# Patient Record
Sex: Female | Born: 1965 | Race: White | Hispanic: No | Smoking: Former smoker
Health system: Southern US, Community
[De-identification: ages and names within clinical notes are randomized; demographics above are authoritative.]

## PROBLEM LIST (undated history)

## (undated) DIAGNOSIS — J189 Pneumonia, unspecified organism: Secondary | ICD-10-CM

## (undated) DIAGNOSIS — M25559 Pain in unspecified hip: Secondary | ICD-10-CM

## (undated) DIAGNOSIS — M1990 Unspecified osteoarthritis, unspecified site: Secondary | ICD-10-CM

## (undated) DIAGNOSIS — E7800 Pure hypercholesterolemia, unspecified: Secondary | ICD-10-CM

## (undated) DIAGNOSIS — F419 Anxiety disorder, unspecified: Secondary | ICD-10-CM

## (undated) DIAGNOSIS — E871 Hypo-osmolality and hyponatremia: Secondary | ICD-10-CM

## (undated) DIAGNOSIS — Z9989 Dependence on other enabling machines and devices: Secondary | ICD-10-CM

## (undated) HISTORY — PX: TONSILLECTOMY: SUR1361

---

## 2000-06-11 ENCOUNTER — Other Ambulatory Visit: Admission: RE | Admit: 2000-06-11 | Discharge: 2000-06-11 | Payer: Self-pay | Admitting: Obstetrics and Gynecology

## 2000-09-13 ENCOUNTER — Encounter (INDEPENDENT_AMBULATORY_CARE_PROVIDER_SITE_OTHER): Payer: Self-pay | Admitting: Specialist

## 2000-09-13 ENCOUNTER — Ambulatory Visit (HOSPITAL_COMMUNITY): Admission: RE | Admit: 2000-09-13 | Discharge: 2000-09-13 | Payer: Self-pay | Admitting: Obstetrics and Gynecology

## 2000-09-28 ENCOUNTER — Other Ambulatory Visit: Admission: RE | Admit: 2000-09-28 | Discharge: 2000-09-28 | Payer: Self-pay | Admitting: Obstetrics and Gynecology

## 2000-11-11 ENCOUNTER — Other Ambulatory Visit: Admission: RE | Admit: 2000-11-11 | Discharge: 2000-11-11 | Payer: Self-pay | Admitting: *Deleted

## 2000-11-11 ENCOUNTER — Encounter (INDEPENDENT_AMBULATORY_CARE_PROVIDER_SITE_OTHER): Payer: Self-pay

## 2000-12-20 ENCOUNTER — Encounter (INDEPENDENT_AMBULATORY_CARE_PROVIDER_SITE_OTHER): Payer: Self-pay | Admitting: Specialist

## 2000-12-20 ENCOUNTER — Other Ambulatory Visit: Admission: RE | Admit: 2000-12-20 | Discharge: 2000-12-20 | Payer: Self-pay | Admitting: Obstetrics and Gynecology

## 2001-07-01 ENCOUNTER — Other Ambulatory Visit: Admission: RE | Admit: 2001-07-01 | Discharge: 2001-07-01 | Payer: Self-pay | Admitting: Obstetrics and Gynecology

## 2002-07-20 ENCOUNTER — Other Ambulatory Visit: Admission: RE | Admit: 2002-07-20 | Discharge: 2002-07-20 | Payer: Self-pay | Admitting: Obstetrics and Gynecology

## 2004-05-09 ENCOUNTER — Encounter: Admission: RE | Admit: 2004-05-09 | Discharge: 2004-05-09 | Payer: Self-pay | Admitting: Family Medicine

## 2005-07-22 ENCOUNTER — Other Ambulatory Visit: Admission: RE | Admit: 2005-07-22 | Discharge: 2005-07-22 | Payer: Self-pay | Admitting: Obstetrics and Gynecology

## 2005-08-06 ENCOUNTER — Ambulatory Visit (HOSPITAL_COMMUNITY): Admission: RE | Admit: 2005-08-06 | Discharge: 2005-08-06 | Payer: Self-pay | Admitting: Obstetrics and Gynecology

## 2005-09-01 ENCOUNTER — Encounter: Admission: RE | Admit: 2005-09-01 | Discharge: 2005-09-01 | Payer: Self-pay | Admitting: Obstetrics and Gynecology

## 2007-11-17 ENCOUNTER — Encounter: Admission: RE | Admit: 2007-11-17 | Discharge: 2007-11-17 | Payer: Self-pay | Admitting: Family Medicine

## 2008-12-11 ENCOUNTER — Ambulatory Visit (HOSPITAL_COMMUNITY): Admission: RE | Admit: 2008-12-11 | Discharge: 2008-12-11 | Payer: Self-pay | Admitting: Family Medicine

## 2008-12-17 ENCOUNTER — Encounter: Admission: RE | Admit: 2008-12-17 | Discharge: 2008-12-17 | Payer: Self-pay | Admitting: Family Medicine

## 2010-11-29 ENCOUNTER — Encounter: Payer: Self-pay | Admitting: Obstetrics and Gynecology

## 2010-11-30 ENCOUNTER — Encounter: Payer: Self-pay | Admitting: Family Medicine

## 2011-03-27 NOTE — Op Note (Signed)
[facility]  Patient:    Catherine Leonard, Catherine Leonard                       MRN: [id number] Proc. Date: 09/13/00 Adm. Date:  [id number] Attending:  Silverio Lay A                           Operative Report  PREOPERATIVE DIAGNOSIS:       Endometrial polyp with metrorrhagia.  POSTOPERATIVE DIAGNOSIS:      Endometrial polyp with metrorrhagia.  OPERATION:                    Hysteroscopy and resection of endometrial                               polyp.  SURGEON:                      Silverio Lay, M.D.  ANESTHESIA:                   MAC sedation plus paracervical block.  ESTIMATED BLOOD LOSS:         Minimal.  DESCRIPTION OF PROCEDURE:     After being informed of the planned procedure with possible complication including bleeding, infection, uterine perforation, need for laparoscopy plus/minus laparotomy, informed consent was obtained. Patient is taken to OR #3 and given MAC sedation.  She is placed in the lithotomy position, prepped and draped in sterile fashion, and bladder is emptied with a Foley catheter.  GYN exam reveals an anteverted uterus, normal size and shape, and two normal adnexa.  A weighted speculum is inserted.  The anterior lip of the cervix is grasped, and a paracervical block is done in the usual fashion using 10 cc of Nesacaine 1%.  The uterus is sounded at 8 cm, and cervix is then systematically dilated with Hegar dilator, up to #31.  This allows [place] to insert operative hysteroscopy under direct visualization.  The uterus is then distended using sorbitol perfusion at 60 mmHg pressure.  This allows [place] good visualization of the uterine cavity showing two normal tubal ostia, normal appearing endometrium, and a posterior mid endometrial polyp of about 1 cm diameter with a thin base.  This polyp is resected using double loop resectoscope at a 60/60 blend.  The base is completely excised.  There is no bleeding.  The endometrial cavity is now normal  in appearance.  Instruments are removed.  Instrument and sponge count complete x 2.  Estimated blood loss is minimal.  Procedure is very well tolerated by the patient who was taken to the recovery room in a well and stable condition. DD:  09/13/00 TD:  09/13/00 Job: [id number]

## 2012-05-16 ENCOUNTER — Other Ambulatory Visit: Payer: Self-pay | Admitting: Family Medicine

## 2012-05-16 DIAGNOSIS — Z1231 Encounter for screening mammogram for malignant neoplasm of breast: Secondary | ICD-10-CM

## 2012-05-18 ENCOUNTER — Ambulatory Visit
Admission: RE | Admit: 2012-05-18 | Discharge: 2012-05-18 | Disposition: A | Discharge status: Home / Self Care | Payer: BC Managed Care – PPO | Source: Ambulatory Visit | Attending: Family Medicine | Admitting: Family Medicine

## 2012-05-18 DIAGNOSIS — Z1231 Encounter for screening mammogram for malignant neoplasm of breast: Secondary | ICD-10-CM

## 2012-05-23 ENCOUNTER — Other Ambulatory Visit: Payer: Self-pay | Admitting: Family Medicine

## 2012-05-23 DIAGNOSIS — R928 Other abnormal and inconclusive findings on diagnostic imaging of breast: Secondary | ICD-10-CM

## 2012-05-26 ENCOUNTER — Ambulatory Visit
Admission: RE | Admit: 2012-05-26 | Discharge: 2012-05-26 | Disposition: A | Discharge status: Home / Self Care | Payer: BC Managed Care – PPO | Source: Ambulatory Visit | Attending: Family Medicine | Admitting: Family Medicine

## 2012-05-26 DIAGNOSIS — R928 Other abnormal and inconclusive findings on diagnostic imaging of breast: Secondary | ICD-10-CM

## 2014-10-03 ENCOUNTER — Other Ambulatory Visit: Payer: Self-pay | Admitting: Family Medicine

## 2014-10-03 ENCOUNTER — Other Ambulatory Visit: Payer: Self-pay

## 2014-10-03 DIAGNOSIS — Z1231 Encounter for screening mammogram for malignant neoplasm of breast: Secondary | ICD-10-CM

## 2014-10-19 ENCOUNTER — Encounter (INDEPENDENT_AMBULATORY_CARE_PROVIDER_SITE_OTHER): Payer: Self-pay

## 2014-10-19 ENCOUNTER — Ambulatory Visit
Admission: RE | Admit: 2014-10-19 | Discharge: 2014-10-19 | Disposition: A | Discharge status: Home / Self Care | Payer: BC Managed Care – PPO | Source: Ambulatory Visit

## 2014-10-19 DIAGNOSIS — Z1231 Encounter for screening mammogram for malignant neoplasm of breast: Secondary | ICD-10-CM

## 2016-03-27 HISTORY — PX: COLONOSCOPY: SHX174

## 2018-10-11 ENCOUNTER — Other Ambulatory Visit: Payer: Self-pay | Admitting: Physician Assistant

## 2018-10-11 DIAGNOSIS — Z1231 Encounter for screening mammogram for malignant neoplasm of breast: Secondary | ICD-10-CM

## 2018-10-12 ENCOUNTER — Ambulatory Visit: Payer: Self-pay

## 2018-11-23 ENCOUNTER — Ambulatory Visit: Payer: Self-pay

## 2019-10-25 ENCOUNTER — Other Ambulatory Visit: Payer: Self-pay

## 2019-10-25 ENCOUNTER — Ambulatory Visit
Admission: RE | Admit: 2019-10-25 | Discharge: 2019-10-25 | Disposition: A | Discharge status: Home / Self Care | Payer: BC Managed Care – PPO | Source: Ambulatory Visit | Attending: Physician Assistant | Admitting: Physician Assistant

## 2019-10-25 DIAGNOSIS — Z1231 Encounter for screening mammogram for malignant neoplasm of breast: Secondary | ICD-10-CM

## 2019-10-27 ENCOUNTER — Other Ambulatory Visit: Payer: Self-pay | Admitting: Physician Assistant

## 2019-10-27 DIAGNOSIS — R928 Other abnormal and inconclusive findings on diagnostic imaging of breast: Secondary | ICD-10-CM

## 2020-04-06 IMAGING — MG DIGITAL SCREENING BILAT W/ TOMO W/ CAD
8 series · 8 of 24 positions shown · non-contrast
Comparison: Previous exam(s).

CLINICAL DATA: Screening.

EXAM:
DIGITAL SCREENING BILATERAL MAMMOGRAM WITH TOMO AND CAD

[L MLO synth-2D]
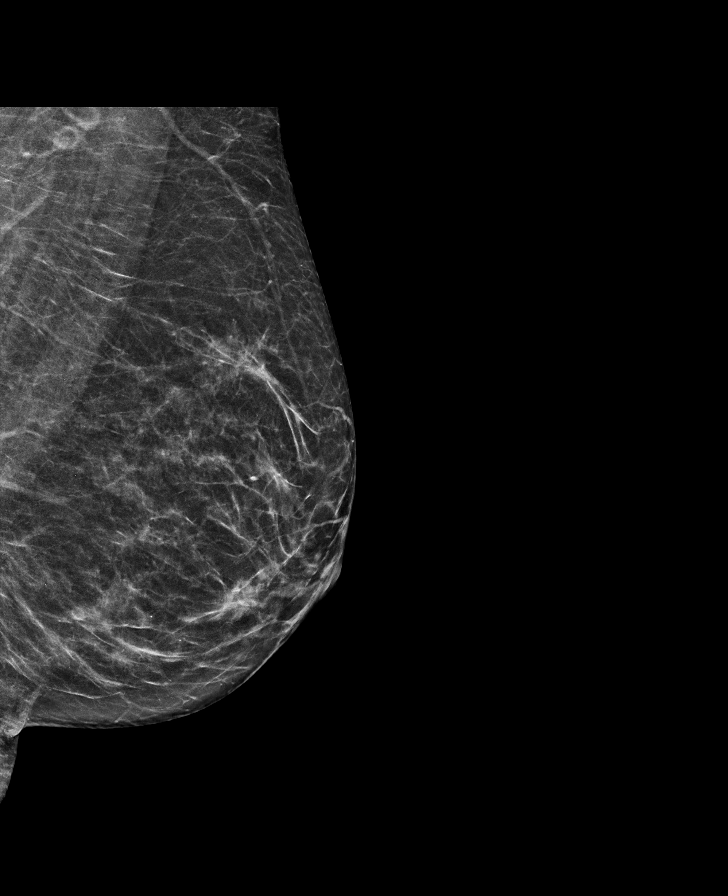

[R CC synth-2D]
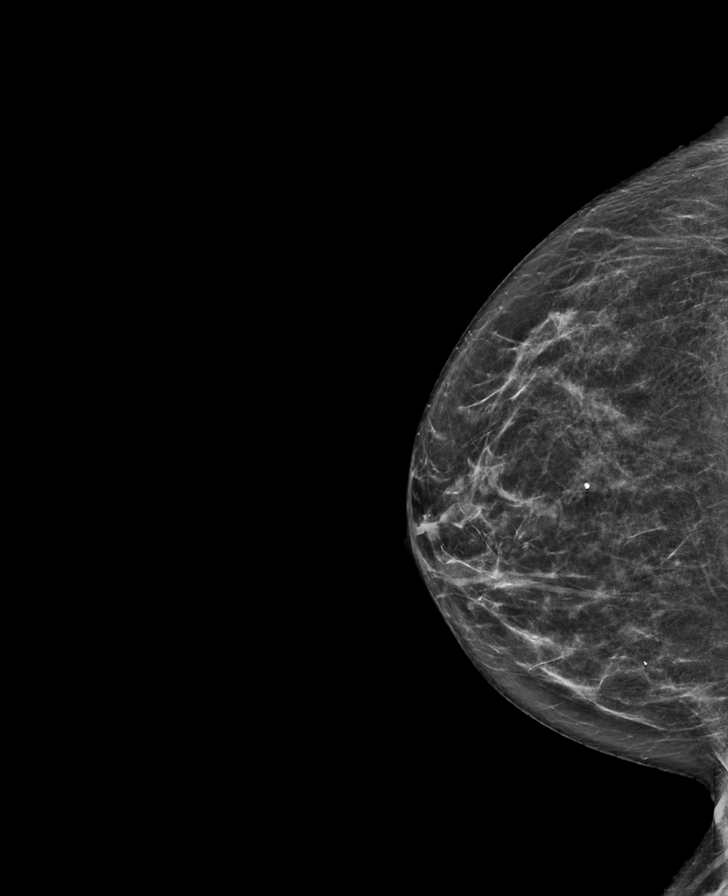

[R MLO synth-2D]
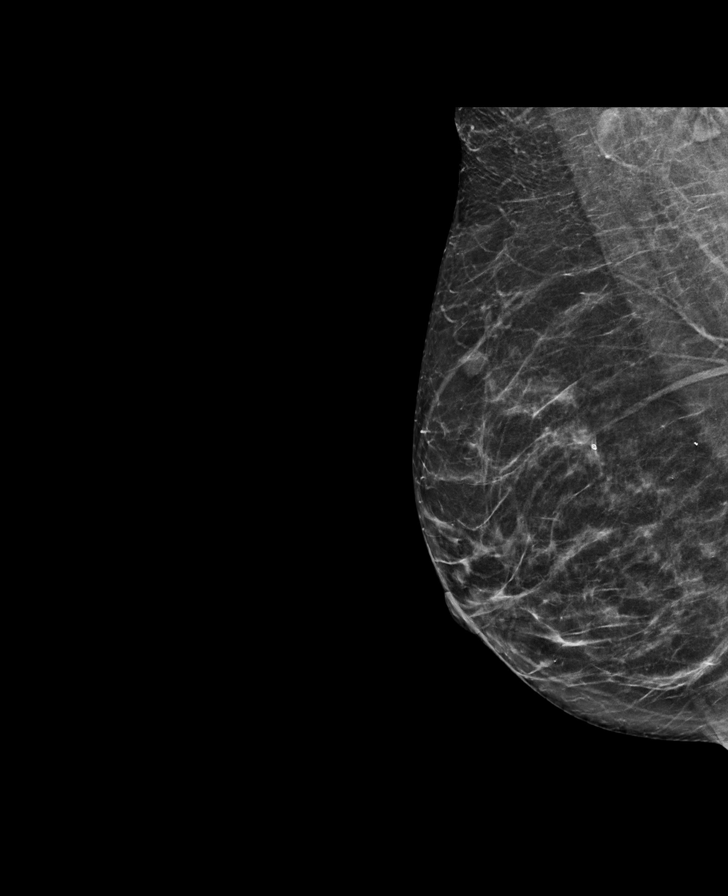

[L CC synth-2D]
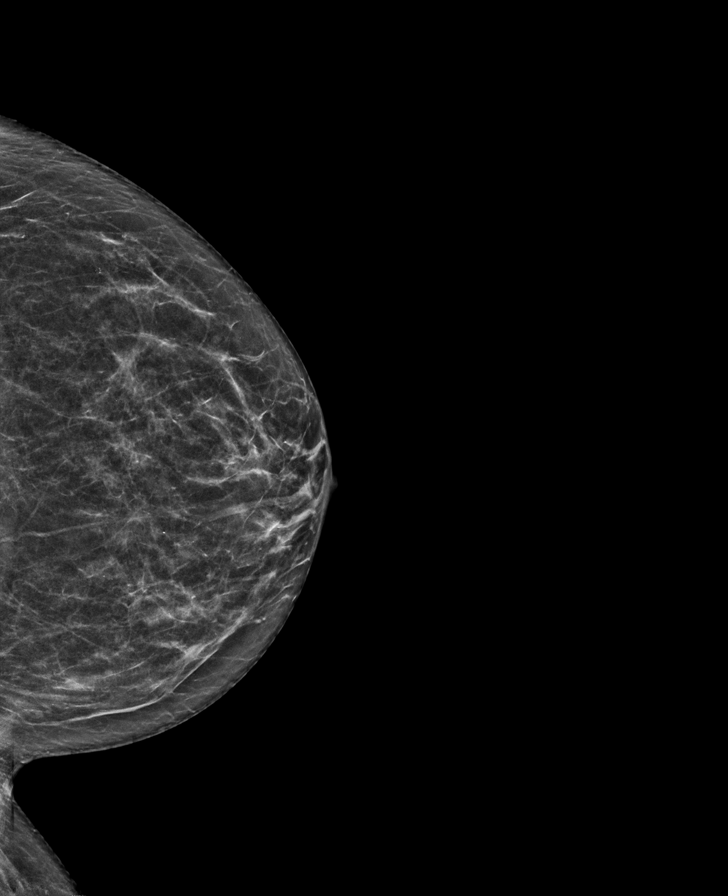

[R CC tomo · tomo slice 31/62.0]
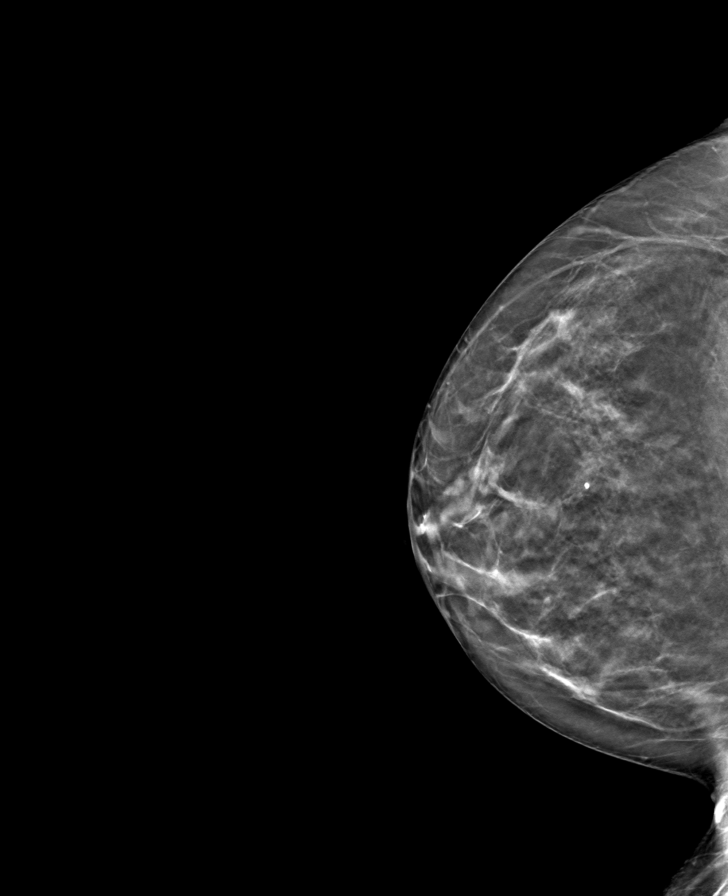

[R MLO tomo · tomo slice 33/64.0]
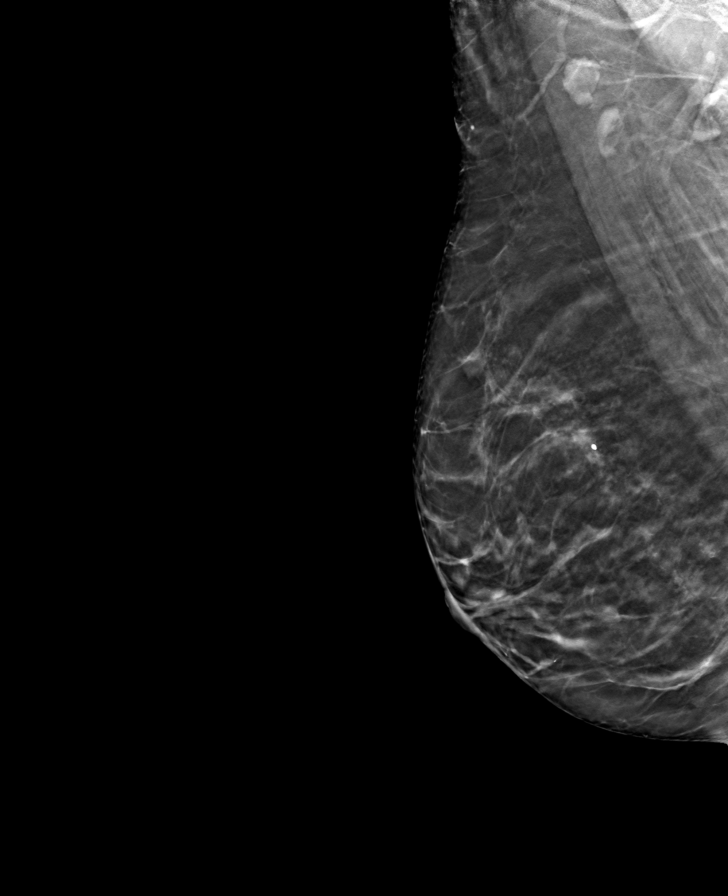

[L CC tomo · tomo slice 29/56.0]
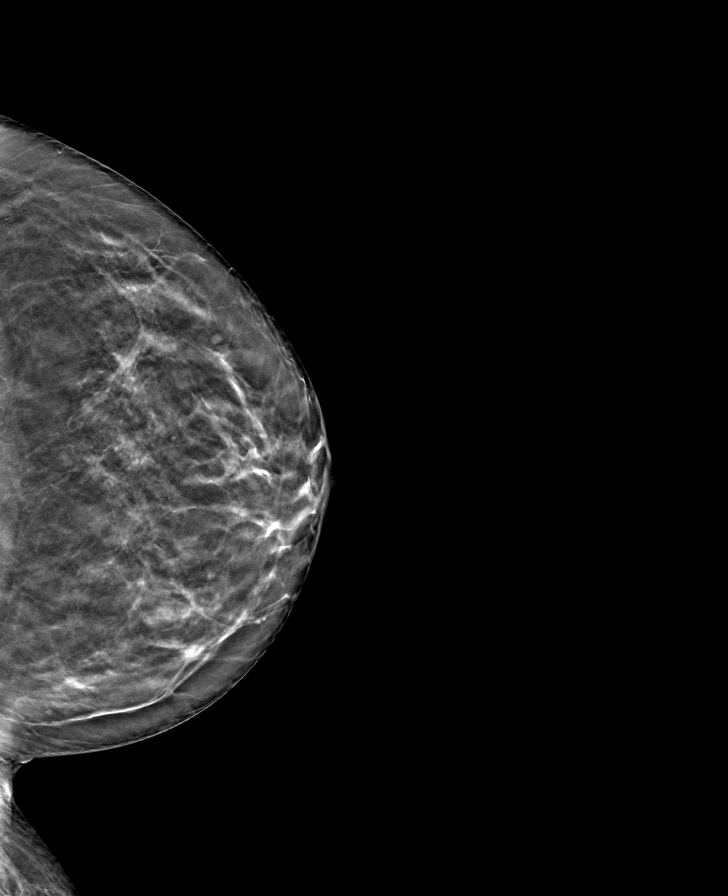

[L MLO tomo · tomo slice 30/59.0]
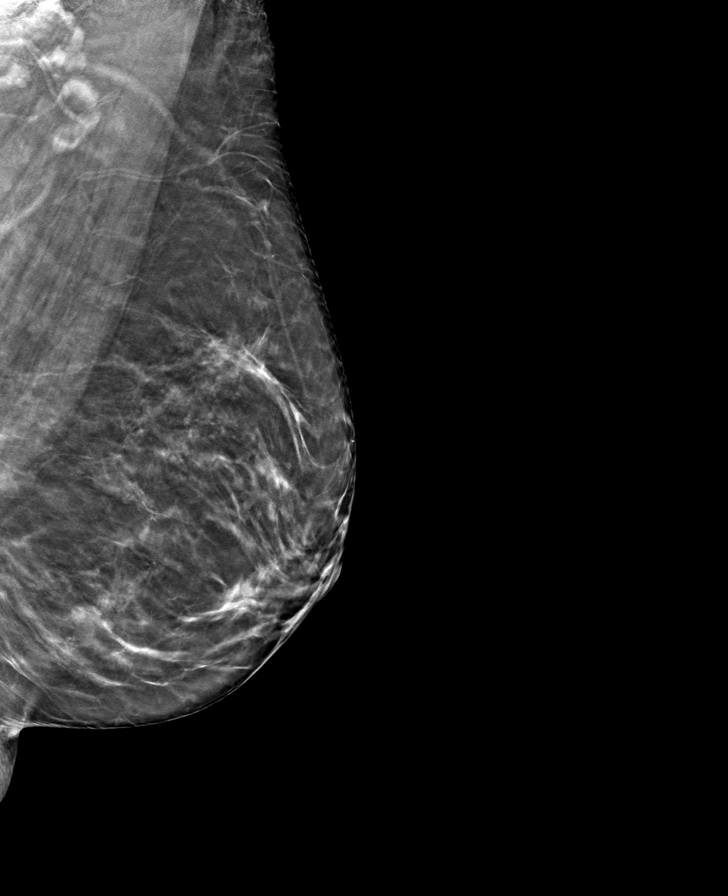

[8 of 24 positions shown; findings below may reference images not displayed]

ACR Breast Density Category c: The breast tissue is heterogeneously
dense, which may obscure small masses.
FINDINGS: In the left breast, possible distortion seen on the MLO tomograms
only (image 45) warrants further evaluation. In addition,
calcifications seen in the outer left breast on the CC view only
warrants further evaluation. In the right breast, no findings
suspicious for malignancy. Images were processed with CAD.
IMPRESSION: Further evaluation is suggested for possible distortion (seen on the
MLO tomograms only image 45) as well as calcifications(in the outer
left breast on the CC view only) in the left breast.

RECOMMENDATION:
Diagnostic mammogram and possibly ultrasound of the left breast.
(Code:[id number])

The patient will be contacted regarding the findings, and additional
imaging will be scheduled.

BI-RADS CATEGORY  0: Incomplete. Need additional imaging evaluation
and/or prior mammograms for comparison.

## 2022-09-05 DIAGNOSIS — N390 Urinary tract infection, site not specified: Secondary | ICD-10-CM | POA: Diagnosis not present

## 2022-11-11 DIAGNOSIS — M9902 Segmental and somatic dysfunction of thoracic region: Secondary | ICD-10-CM | POA: Diagnosis not present

## 2022-11-11 DIAGNOSIS — M9905 Segmental and somatic dysfunction of pelvic region: Secondary | ICD-10-CM | POA: Diagnosis not present

## 2022-11-11 DIAGNOSIS — M9903 Segmental and somatic dysfunction of lumbar region: Secondary | ICD-10-CM | POA: Diagnosis not present

## 2022-11-11 DIAGNOSIS — M9901 Segmental and somatic dysfunction of cervical region: Secondary | ICD-10-CM | POA: Diagnosis not present

## 2023-01-07 DIAGNOSIS — M545 Low back pain, unspecified: Secondary | ICD-10-CM | POA: Diagnosis not present

## 2023-01-20 DIAGNOSIS — M545 Low back pain, unspecified: Secondary | ICD-10-CM | POA: Diagnosis not present

## 2023-01-28 DIAGNOSIS — M48062 Spinal stenosis, lumbar region with neurogenic claudication: Secondary | ICD-10-CM | POA: Diagnosis not present

## 2023-01-28 DIAGNOSIS — M792 Neuralgia and neuritis, unspecified: Secondary | ICD-10-CM | POA: Diagnosis not present

## 2023-02-22 DIAGNOSIS — M5416 Radiculopathy, lumbar region: Secondary | ICD-10-CM | POA: Diagnosis not present

## 2023-02-27 DIAGNOSIS — M5416 Radiculopathy, lumbar region: Secondary | ICD-10-CM | POA: Diagnosis not present

## 2023-03-10 DIAGNOSIS — M5431 Sciatica, right side: Secondary | ICD-10-CM | POA: Diagnosis not present

## 2023-03-10 DIAGNOSIS — M545 Low back pain, unspecified: Secondary | ICD-10-CM | POA: Diagnosis not present

## 2023-03-11 DIAGNOSIS — M792 Neuralgia and neuritis, unspecified: Secondary | ICD-10-CM | POA: Diagnosis not present

## 2023-03-11 DIAGNOSIS — M48062 Spinal stenosis, lumbar region with neurogenic claudication: Secondary | ICD-10-CM | POA: Diagnosis not present

## 2023-03-16 DIAGNOSIS — M5451 Vertebrogenic low back pain: Secondary | ICD-10-CM | POA: Diagnosis not present

## 2023-04-01 ENCOUNTER — Ambulatory Visit (HOSPITAL_COMMUNITY): Payer: Self-pay | Admitting: Orthopedic Surgery

## 2023-04-13 DIAGNOSIS — M4316 Spondylolisthesis, lumbar region: Secondary | ICD-10-CM | POA: Diagnosis not present

## 2023-04-16 ENCOUNTER — Encounter (HOSPITAL_COMMUNITY): Payer: Self-pay

## 2023-04-16 NOTE — Progress Notes (Signed)
Chest x-ray - n/a EKG - n/a Stress Test - n/a ECHO - n/a Cardiac Cath - n/a  ICD Pacemaker/Loop - n/a  Sleep Study -  n/a CPAP - none  Diabetes Type - n/a  ERAS: Clears til 7:30 AM DOS  Anesthesia review: Yes  STOP now taking any Aspirin (unless otherwise instructed by your surgeon), Aleve, Naproxen, Ibuprofen, Motrin, Advil, Goody's, BC's, all herbal medications, fish oil, and all vitamins.   Coronavirus Screening Do you have any of the following symptoms:  Cough yes/no: No Fever (>100.65F)  yes/no: No Runny nose yes/no: No Sore throat yes/no: No Difficulty breathing/shortness of breath  Occasional  Have you traveled in the last 14 days and where? yes/no: No  Patient verbalized understanding of instructions that were given to them at the PAT appointment. Patient was also instructed that they will need to review over the PAT instructions again at home before surgery.

## 2023-04-16 NOTE — Pre-Procedure Instructions (Signed)
Surgical Instructions    Your procedure is scheduled on April 26, 2023.  Report to [facility] Main Entrance "A" at 8:30 A.M., then check in with the Admitting office.  Call this number if you have problems the morning of surgery:  [phone]  If you have any questions prior to your surgery date call [phone]: Open Monday-Friday 8am-4pm If you experience any cold or flu symptoms such as cough, fever, chills, shortness of breath, etc. between now and your scheduled surgery, please notify us at the above number.     Remember:  Do not eat after midnight the night before your surgery  You may drink clear liquids until 7:30 AM the morning of your surgery.   Clear liquids allowed are: Water, Non-Citrus Juices (without pulp), Carbonated Beverages, Clear Tea, Black Coffee Only (NO MILK, CREAM OR POWDERED CREAMER of any kind), and Gatorade.     Take these medicines the morning of surgery with A SIP OF WATER:  gabapentin (NEURONTIN)   oxyCODONE-acetaminophen (PERCOCET/ROXICET)   Carboxymethylcellul-Glycerin (CLEAR EYES FOR DRY EYES)  ALPRAZolam Prudy Feeler) - may take if needed   As of today, STOP taking any Aspirin (unless otherwise instructed by your surgeon) Aleve, Naproxen, Ibuprofen, Motrin, Advil, Goody's, BC's, all herbal medications, fish oil, and all vitamins.                     Do NOT Smoke (Tobacco/Vaping) for 24 hours prior to your procedure.  If you use a CPAP at night, you may bring your mask/headgear for your overnight stay.   Contacts, glasses, piercing's, hearing aid's, dentures or partials may not be worn into surgery, please bring cases for these belongings.    For patients admitted to the hospital, discharge time will be determined by your treatment team.   Patients discharged the day of surgery will not be allowed to drive home, and someone needs to stay with them for 24 hours.  SURGICAL WAITING ROOM VISITATION Patients having surgery or a procedure may have no more  than 2 support people in the waiting area - these visitors may rotate.   Children under the age of 34 must have an adult with them who is not the patient. If the patient needs to stay at the hospital during part of their recovery, the visitor guidelines for inpatient rooms apply. Pre-op nurse will coordinate an appropriate time for 1 support person to accompany patient in pre-op.  This support person may not rotate.   Please refer to the [facility] website for the visitor guidelines for Inpatients (after your surgery is over and you are in a regular room).    If you received a COVID test during your pre-op visit  it is requested that you wear a mask when out in public, stay away from anyone that may not be feeling well and notify your surgeon if you develop symptoms. If you have been in contact with anyone that has tested positive in the last 10 days please notify you surgeon.    Pre-operative 5 CHG Bath Instructions   You can play a key role in reducing the risk of infection after surgery. Your skin needs to be as free of germs as possible. You can reduce the number of germs on your skin by washing with CHG (chlorhexidine gluconate) soap before surgery. CHG is an antiseptic soap that kills germs and continues to kill germs even after washing.   DO NOT use if you have an allergy to chlorhexidine/CHG or antibacterial soaps.  If your skin becomes reddened or irritated, stop using the CHG and notify one of our RNs at [phone].   Please shower with the CHG soap starting 4 days before surgery using the following schedule:     Please keep in mind the following:  DO NOT shave, including legs and underarms, starting the day of your first shower.   You may shave your face at any point before/day of surgery.  Place clean sheets on your bed the day you start using CHG soap. Use a clean washcloth (not used since being washed) for each shower. DO NOT sleep with pets once you start using the CHG.    CHG Shower Instructions:  If you choose to wash your hair and private area, wash first with your normal shampoo/soap.  After you use shampoo/soap, rinse your hair and body thoroughly to remove shampoo/soap residue.  Turn the water OFF and apply about 3 tablespoons (45 ml) of CHG soap to a CLEAN washcloth.  Apply CHG soap ONLY FROM YOUR NECK DOWN TO YOUR TOES (washing for 3-5 minutes)  DO NOT use CHG soap on face, private areas, open wounds, or sores.  Pay special attention to the area where your surgery is being performed.  If you are having back surgery, having someone wash your back for you may be helpful. Wait 2 minutes after CHG soap is applied, then you may rinse off the CHG soap.  Pat dry with a clean towel  Put on clean clothes/pajamas   If you choose to wear lotion, please use ONLY the CHG-compatible lotions on the back of this paper.     Additional instructions for the day of surgery: DO NOT APPLY any lotions, deodorants, cologne, or perfumes.   Put on clean/comfortable clothes.  Do not wear nail polish, gel polish, artificial nails, or any other type of covering on natural nails (fingers and toes) Do not bring valuables to the hospital. [facility] is not responsible for any belongings or valuables. Do not wear jewelry or makeup Brush your teeth.  Ask your nurse before applying any prescription medications to the skin.      CHG Compatible Lotions   Aveeno Moisturizing lotion  Cetaphil Moisturizing Cream  Cetaphil Moisturizing Lotion  Clairol Herbal Essence Moisturizing Lotion, Dry Skin  Clairol Herbal Essence Moisturizing Lotion, Extra Dry Skin  Clairol Herbal Essence Moisturizing Lotion, Normal Skin  Curel Age Defying Therapeutic Moisturizing Lotion with Alpha Hydroxy  Curel Extreme Care Body Lotion  Curel Soothing Hands Moisturizing Hand Lotion  Curel Therapeutic Moisturizing Cream, Fragrance-Free  Curel Therapeutic Moisturizing Lotion, Fragrance-Free  Curel  Therapeutic Moisturizing Lotion, Original Formula  Eucerin Daily Replenishing Lotion  Eucerin Dry Skin Therapy Plus Alpha Hydroxy Crme  Eucerin Dry Skin Therapy Plus Alpha Hydroxy Lotion  Eucerin Original Crme  Eucerin Original Lotion  Eucerin Plus Crme Eucerin Plus Lotion  Eucerin TriLipid Replenishing Lotion  Keri Anti-Bacterial Hand Lotion  Keri Deep Conditioning Original Lotion Dry Skin Formula Softly Scented  Keri Deep Conditioning Original Lotion, Fragrance Free Sensitive Skin Formula  Keri Lotion Fast Absorbing Fragrance Free Sensitive Skin Formula  Keri Lotion Fast Absorbing Softly Scented Dry Skin Formula  Keri Original Lotion  Keri Skin Renewal Lotion Keri Silky Smooth Lotion  Keri Silky Smooth Sensitive Skin Lotion  Nivea Body Creamy Conditioning Oil  Nivea Body Extra Enriched Teacher, adult education Moisturizing Lotion Nivea Crme  Nivea Skin Firming Lotion  NutraDerm 30 Skin Lotion  NutraDerm Skin Lotion  NutraDerm Therapeutic Skin Cream  NutraDerm Therapeutic Skin Lotion  ProShield Protective Hand Cream  Provon moisturizing lotion   Please read over the following fact sheets that you were given.

## 2023-04-19 ENCOUNTER — Encounter (HOSPITAL_COMMUNITY)
Admission: RE | Admit: 2023-04-19 | Discharge: 2023-04-19 | Disposition: A | Discharge status: Home / Self Care | Payer: BC Managed Care – PPO | Source: Ambulatory Visit | Attending: Orthopedic Surgery | Admitting: Orthopedic Surgery

## 2023-04-19 ENCOUNTER — Encounter (HOSPITAL_COMMUNITY): Payer: Self-pay

## 2023-04-19 VITALS — BP 154/99 | HR 79 | Temp 98.4°F | Resp 18 | Ht 60.0 in | Wt 127.8 lb

## 2023-04-19 DIAGNOSIS — Z01812 Encounter for preprocedural laboratory examination: Secondary | ICD-10-CM | POA: Insufficient documentation

## 2023-04-19 DIAGNOSIS — Z01818 Encounter for other preprocedural examination: Secondary | ICD-10-CM

## 2023-04-19 HISTORY — DX: Pain in unspecified hip: M25.559

## 2023-04-19 HISTORY — DX: Unspecified osteoarthritis, unspecified site: M19.90

## 2023-04-19 HISTORY — DX: Dependence on other enabling machines and devices: Z99.89

## 2023-04-19 HISTORY — DX: Pneumonia, unspecified organism: J18.9

## 2023-04-19 HISTORY — DX: Anxiety disorder, unspecified: F41.9

## 2023-04-19 HISTORY — DX: Pure hypercholesterolemia, unspecified: E78.00

## 2023-04-19 HISTORY — DX: Hypo-osmolality and hyponatremia: E87.1

## 2023-04-19 LAB — CBC
HCT: 35.1 % — ABNORMAL LOW (ref 36.0–46.0)
Hemoglobin: 11.9 g/dL — ABNORMAL LOW (ref 12.0–15.0)
MCH: 34.3 pg — ABNORMAL HIGH (ref 26.0–34.0)
MCHC: 33.9 g/dL (ref 30.0–36.0)
MCV: 101.2 fL — ABNORMAL HIGH (ref 80.0–100.0)
Platelets: 378 10*3/uL (ref 150–400)
RBC: 3.47 MIL/uL — ABNORMAL LOW (ref 3.87–5.11)
RDW: 12.6 % (ref 11.5–15.5)
WBC: 6.9 10*3/uL (ref 4.0–10.5)
nRBC: 0 % (ref 0.0–0.2)

## 2023-04-19 LAB — TYPE AND SCREEN
ABO/RH(D): AB POS
Antibody Screen: NEGATIVE

## 2023-04-19 LAB — BASIC METABOLIC PANEL
Anion gap: 10 (ref 5–15)
BUN: 7 mg/dL (ref 6–20)
CO2: 28 mmol/L (ref 22–32)
Calcium: 9 mg/dL (ref 8.9–10.3)
Chloride: 93 mmol/L — ABNORMAL LOW (ref 98–111)
Creatinine, Ser: 0.63 mg/dL (ref 0.44–1.00)
GFR, Estimated: >60 mL/min (ref >60)
Glucose, Bld: 94 mg/dL (ref 70–99)
Potassium: 3.7 mmol/L (ref 3.5–5.1)
Sodium: 131 mmol/L — ABNORMAL LOW (ref 135–145)

## 2023-04-19 LAB — SURGICAL PCR SCREEN
MRSA, PCR: NEGATIVE
Staphylococcus aureus: NEGATIVE

## 2023-04-20 ENCOUNTER — Encounter (HOSPITAL_COMMUNITY): Payer: Self-pay

## 2023-04-20 DIAGNOSIS — M5451 Vertebrogenic low back pain: Secondary | ICD-10-CM | POA: Diagnosis not present

## 2023-04-20 DIAGNOSIS — M5416 Radiculopathy, lumbar region: Secondary | ICD-10-CM | POA: Diagnosis not present

## 2023-04-25 ENCOUNTER — Other Ambulatory Visit: Payer: Self-pay

## 2023-04-25 ENCOUNTER — Emergency Department (EMERGENCY_DEPARTMENT_HOSPITAL)
Admission: EM | Admit: 2023-04-25 | Discharge: 2023-04-25 | Disposition: A | Discharge status: Home / Self Care | Payer: BC Managed Care – PPO | Source: Home / Self Care | Attending: Emergency Medicine | Admitting: Emergency Medicine

## 2023-04-25 ENCOUNTER — Emergency Department (HOSPITAL_BASED_OUTPATIENT_CLINIC_OR_DEPARTMENT_OTHER): Payer: BC Managed Care – PPO

## 2023-04-25 ENCOUNTER — Encounter (HOSPITAL_COMMUNITY): Payer: Self-pay | Admitting: Emergency Medicine

## 2023-04-25 DIAGNOSIS — M5136 Other intervertebral disc degeneration, lumbar region: Secondary | ICD-10-CM

## 2023-04-25 DIAGNOSIS — R0689 Other abnormalities of breathing: Secondary | ICD-10-CM | POA: Diagnosis not present

## 2023-04-25 DIAGNOSIS — Z635 Disruption of family by separation and divorce: Secondary | ICD-10-CM | POA: Diagnosis not present

## 2023-04-25 DIAGNOSIS — F419 Anxiety disorder, unspecified: Secondary | ICD-10-CM | POA: Diagnosis not present

## 2023-04-25 DIAGNOSIS — G8918 Other acute postprocedural pain: Secondary | ICD-10-CM | POA: Diagnosis not present

## 2023-04-25 DIAGNOSIS — I471 Supraventricular tachycardia, unspecified: Secondary | ICD-10-CM | POA: Diagnosis not present

## 2023-04-25 DIAGNOSIS — I10 Essential (primary) hypertension: Secondary | ICD-10-CM | POA: Diagnosis not present

## 2023-04-25 DIAGNOSIS — I4719 Other supraventricular tachycardia: Secondary | ICD-10-CM

## 2023-04-25 DIAGNOSIS — I959 Hypotension, unspecified: Secondary | ICD-10-CM | POA: Diagnosis not present

## 2023-04-25 DIAGNOSIS — R030 Elevated blood-pressure reading, without diagnosis of hypertension: Secondary | ICD-10-CM

## 2023-04-25 DIAGNOSIS — M48062 Spinal stenosis, lumbar region with neurogenic claudication: Secondary | ICD-10-CM | POA: Diagnosis not present

## 2023-04-25 DIAGNOSIS — I498 Other specified cardiac arrhythmias: Secondary | ICD-10-CM | POA: Diagnosis not present

## 2023-04-25 DIAGNOSIS — M5116 Intervertebral disc disorders with radiculopathy, lumbar region: Secondary | ICD-10-CM | POA: Diagnosis not present

## 2023-04-25 DIAGNOSIS — K0889 Other specified disorders of teeth and supporting structures: Secondary | ICD-10-CM | POA: Diagnosis not present

## 2023-04-25 DIAGNOSIS — Z79899 Other long term (current) drug therapy: Secondary | ICD-10-CM | POA: Diagnosis not present

## 2023-04-25 DIAGNOSIS — I951 Orthostatic hypotension: Secondary | ICD-10-CM | POA: Diagnosis not present

## 2023-04-25 DIAGNOSIS — R000 Tachycardia, unspecified: Secondary | ICD-10-CM | POA: Diagnosis not present

## 2023-04-25 DIAGNOSIS — G8929 Other chronic pain: Secondary | ICD-10-CM | POA: Diagnosis not present

## 2023-04-25 DIAGNOSIS — M5416 Radiculopathy, lumbar region: Secondary | ICD-10-CM | POA: Diagnosis not present

## 2023-04-25 DIAGNOSIS — R0789 Other chest pain: Secondary | ICD-10-CM | POA: Diagnosis not present

## 2023-04-25 DIAGNOSIS — M48061 Spinal stenosis, lumbar region without neurogenic claudication: Secondary | ICD-10-CM | POA: Diagnosis not present

## 2023-04-25 DIAGNOSIS — Z720 Tobacco use: Secondary | ICD-10-CM | POA: Diagnosis not present

## 2023-04-25 DIAGNOSIS — R002 Palpitations: Secondary | ICD-10-CM | POA: Diagnosis not present

## 2023-04-25 DIAGNOSIS — J189 Pneumonia, unspecified organism: Secondary | ICD-10-CM | POA: Diagnosis not present

## 2023-04-25 DIAGNOSIS — M4156 Other secondary scoliosis, lumbar region: Secondary | ICD-10-CM | POA: Diagnosis not present

## 2023-04-25 DIAGNOSIS — E7800 Pure hypercholesterolemia, unspecified: Secondary | ICD-10-CM | POA: Diagnosis not present

## 2023-04-25 DIAGNOSIS — M4186 Other forms of scoliosis, lumbar region: Secondary | ICD-10-CM | POA: Diagnosis not present

## 2023-04-25 DIAGNOSIS — Z981 Arthrodesis status: Secondary | ICD-10-CM | POA: Diagnosis not present

## 2023-04-25 DIAGNOSIS — M51369 Other intervertebral disc degeneration, lumbar region without mention of lumbar back pain or lower extremity pain: Secondary | ICD-10-CM

## 2023-04-25 DIAGNOSIS — Z87891 Personal history of nicotine dependence: Secondary | ICD-10-CM | POA: Diagnosis not present

## 2023-04-25 LAB — ECHOCARDIOGRAM COMPLETE
Area-P 1/2: 5.27 cm2
Calc EF: 65.6 %
Height: 60 in
S' Lateral: 2.4 cm
Single Plane A2C EF: 65.9 %
Single Plane A4C EF: 64.9 %
Weight: 1968 oz

## 2023-04-25 LAB — CBC
HCT: 39 % (ref 36.0–46.0)
Hemoglobin: 13.5 g/dL (ref 12.0–15.0)
MCH: 34.4 pg — ABNORMAL HIGH (ref 26.0–34.0)
MCHC: 34.6 g/dL (ref 30.0–36.0)
MCV: 99.5 fL (ref 80.0–100.0)
Platelets: 420 10*3/uL — ABNORMAL HIGH (ref 150–400)
RBC: 3.92 MIL/uL (ref 3.87–5.11)
RDW: 13.2 % (ref 11.5–15.5)
WBC: 8.1 10*3/uL (ref 4.0–10.5)
nRBC: 0 % (ref 0.0–0.2)

## 2023-04-25 LAB — COMPREHENSIVE METABOLIC PANEL
ALT: 22 U/L (ref 0–44)
AST: 26 U/L (ref 15–41)
Albumin: 3.8 g/dL (ref 3.5–5.0)
Alkaline Phosphatase: 61 U/L (ref 38–126)
Anion gap: 15 (ref 5–15)
BUN: 6 mg/dL (ref 6–20)
CO2: 21 mmol/L — ABNORMAL LOW (ref 22–32)
Calcium: 8.9 mg/dL (ref 8.9–10.3)
Chloride: 97 mmol/L — ABNORMAL LOW (ref 98–111)
Creatinine, Ser: 0.75 mg/dL (ref 0.44–1.00)
GFR, Estimated: >60 mL/min (ref >60)
Glucose, Bld: 115 mg/dL — ABNORMAL HIGH (ref 70–99)
Potassium: 3.5 mmol/L (ref 3.5–5.1)
Sodium: 133 mmol/L — ABNORMAL LOW (ref 135–145)
Total Bilirubin: 0.8 mg/dL (ref 0.3–1.2)
Total Protein: 6.8 g/dL (ref 6.5–8.1)

## 2023-04-25 LAB — TSH: TSH: 1.562 u[IU]/mL (ref 0.350–4.500)

## 2023-04-25 MED ORDER — POTASSIUM CHLORIDE CRYS ER 20 MEQ PO TBCR
40.0000 meq | EXTENDED_RELEASE_TABLET | Freq: Once | ORAL | Status: AC
  Administered 2023-04-25: 40 meq via ORAL
  Filled 2023-04-25: qty 2

## 2023-04-25 MED ORDER — METOPROLOL SUCCINATE ER 50 MG PO TB24: 50.0000 mg | ORAL_TABLET | Freq: Every day | ORAL | 0 refills | Status: DC

## 2023-04-25 MED ORDER — METOPROLOL SUCCINATE ER 25 MG PO TB24
50.0000 mg | ORAL_TABLET | Freq: Once | ORAL | Status: AC
  Administered 2023-04-25: 50 mg via ORAL
  Filled 2023-04-25: qty 2

## 2023-04-25 NOTE — Consult Note (Signed)
CARDIOLOGY CONSULT NOTE       Patient ID: Catherine Leonard MRN: [id number] DOB/AGE: 59-24-1967 59 y.o.  Admit date: 04/25/2023 Referring Physician: Joni Reining Primary Physician: Lezlie Lye, FNP Primary Cardiologist: New Reason for Consultation: SVT  Active Problems:   * No active hospital problems. *   HPI:  57 y.o. seen in ER post [facility] for SVT. History of arthritis/scoliosis chronic back pain uses cane/wheel chair. Has been waiting months to have back surgery with Dr Shon Baton scheduled for tomorrow Awoke this am and had rapid palpitations with weakness and nausea. Lasted 30 minutes before EMS arrived Narrow complex SVT rate > 200 and cardioverted without any adenosine given. Multiple ECG's post conversion normal No ST elevation/pericarditis. No WPW or accessory pathway. 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 Originally from [place] Husband with her in room Feels fine now  Preliminary TTE at bedside normal EF 65% no effusion   ROS All other systems reviewed and negative except as noted above  Past Medical History:  Diagnosis Date   Ambulates with cane    and wheelchair r/t back pain   Anxiety    Arthritis    Chronic hyponatremia    Hip pain    bilateral, in [place]   Pneumonia    x 1 at age 56   Pure hypercholesterolemia     No family history on file.  Social History   Socioeconomic History   Marital status: Legally Separated    Spouse name: Not on file   Number of children: Not on file   Years of education: Not on file   Highest education level: Not on file  Occupational History   Not on file  Tobacco Use   Smoking status: Former    Packs/day: 0.50    Years: 40.00    Additional pack years: 0.00    Total pack years: 20.00    Types: Cigarettes    Quit date: 03/17/2023    Years since quitting: 0.1   Smokeless tobacco: Current  Vaping Use    Vaping Use: Some days   Substances: CBD  Substance and Sexual Activity   Alcohol use: Yes    Comment: wine - 1 bottle per night   Drug use: Yes    Types: Marijuana    Comment: daily- informed pt to not smoke 24 hrs prior to surgery   Sexual activity: Yes    Birth control/protection: Post-menopausal  Other Topics Concern   Not on file  Social History Narrative   Not on file   Social Determinants of Health   Financial Resource Strain: Not on file  Food Insecurity: Not on file  Transportation Needs: Not on file  Physical Activity: Not on file  Stress: Not on file  Social Connections: Not on file  Intimate Partner Violence: Not on file    Past Surgical History:  Procedure Laterality Date   COLONOSCOPY  03/27/2016   in [place]   TONSILLECTOMY     as a child     No current facility-administered medications for this encounter.  Current Outpatient Medications:    ALPRAZolam (XANAX) 0.5 MG tablet, Take 0.5 mg by mouth 2 (two) times daily as needed for anxiety., Disp: , Rfl:    Carboxymethylcellul-Glycerin (CLEAR EYES FOR DRY EYES) 1-0.25 % SOLN, Place 1-2 drops into both eyes daily. After showering, Disp: , Rfl:  gabapentin (NEURONTIN) 300 MG capsule, Take 600 mg by mouth 3 (three) times daily., Disp: , Rfl:    oxyCODONE-acetaminophen (PERCOCET/ROXICET) 5-325 MG tablet, Take 1 tablet by mouth in the morning, at noon, in the evening, and at bedtime. Takes with advil, Disp: , Rfl:     Physical Exam: Blood pressure (!) 140/87, pulse 95, temperature 98.6 F (37 C), temperature source Oral, resp. rate 17, height 5' (1.524 m), weight 55.8 kg, SpO2 97 %.    No distress Scoliosis back pain Lungs clear No murmur  Abdomen benign No edema Palpable pedal pulses   Labs:   Lab Results  Component Value Date   WBC 8.1 04/25/2023   HGB 13.5 04/25/2023   HCT 39.0 04/25/2023   MCV 99.5 04/25/2023   PLT 420 (H) 04/25/2023    Recent Labs  Lab 04/25/23 0853  NA 133*  K 3.5  CL  97*  CO2 21*  BUN 6  CREATININE 0.75  CALCIUM 8.9  PROT 6.8  BILITOT 0.8  ALKPHOS 61  ALT 22  AST 26  GLUCOSE 115*   No results found for: "CKTOTAL", "CKMB", "CKMBINDEX", "TROPONINI" No results found for: "CHOL" No results found for: "HDL" No results found for: "LDLCALC" No results found for: "TRIG" No results found for: "CHOLHDL" No results found for: "LDLDIRECT"    Radiology: No results found.  EKG: Post D[facility] NSR normal normal QT no accessory pathway   ASSESSMENT AND PLAN:   SVT:  first occurrence converted Normal baseline ECG and normal TTE to my review. Start Toprol 50 mg give dose in ER Supplement K Told her to take Toprol 50 mg in am before coming to [facility] for surgery Ok to proceed with back surgery with Dr Shon Baton under general anesthesia. Please call [facility] in hospital or post op if any cardiac issues   Signed: Charlton Haws 04/25/2023, 10:32 AM

## 2023-04-25 NOTE — Discharge Instructions (Addendum)
It was our pleasure to provide your ER care today - we hope that you feel better.  Take toprol as prescribed.   Follow up with your planned surgery as planned.   Follow up with cardiology in office in the next couple weeks.   Return to ER if worse, new symptoms, fevers, new/severe pain, persistent fast heart beating, weak/fainting, chest pain, trouble breathing, or other concern.

## 2023-04-25 NOTE — Progress Notes (Signed)
  Echocardiogram 2D Echocardiogram has been performed.  Catherine Leonard 04/25/2023, 10:39 AM

## 2023-04-25 NOTE — ED Provider Notes (Signed)
[facility] Provider Note   CSN: [id number] Arrival date & time: 04/25/23  0846     History  Chief Complaint  Patient presents with   Weakness   Palpitations    Catherine Leonard is a 57 y.o. female.  Patient presents with palpitations and subsequent onset generalized weakness, sob, diaphoresis.  Pt was at rest this AM when symptom onset, and called EMS. EMS noted narrow complex tachycardia with HR 230s, and blood pressure low so cardioverted with conversion to nsr. Pt denies any recent chest pain or exertional chest pain. No hx cad, or fam hx premature cad. No unusual doe or fatigue. No hx svt, afib, vt or other dysrhythmia. No recent syncope. Pt indicates has ddd, and sciatica, and has plans for surgery for same tomorrow/Dr Shon Baton.   The history is provided by the patient, medical records and the EMS personnel.  Chest Pain Associated symptoms: palpitations   Associated symptoms: no abdominal pain, no back pain, no cough, no fever, no headache, no numbness, no shortness of breath, no vomiting and no weakness        Home Medications Prior to Admission medications   Medication Sig Start Date End Date Taking? Authorizing Provider  ALPRAZolam Prudy Feeler) 0.5 MG tablet Take 0.5 mg by mouth 2 (two) times daily as needed for anxiety.   Yes [provider]  Carboxymethylcellul-Glycerin (CLEAR EYES FOR DRY EYES) 1-0.25 % SOLN Place 1-2 drops into both eyes daily. After showering   Yes [provider]  gabapentin (NEURONTIN) 300 MG capsule Take 600 mg by mouth 3 (three) times daily.   Yes [provider]  oxyCODONE-acetaminophen (PERCOCET/ROXICET) 5-325 MG tablet Take 1 tablet by mouth in the morning, at noon, in the evening, and at bedtime. Takes with advil   Yes [provider]      Allergies    Patient has no known allergies.    Review of Systems   Review of Systems  Constitutional:  Negative for chills and  fever.  HENT:  Negative for sore throat.   Eyes:  Negative for visual disturbance.  Respiratory:  Negative for cough and shortness of breath.   Cardiovascular:  Positive for palpitations. Negative for chest pain and leg swelling.  Gastrointestinal:  Negative for abdominal pain, blood in stool, diarrhea and vomiting.  Genitourinary:  Negative for dysuria and flank pain.  Musculoskeletal:  Negative for back pain and neck pain.  Skin:  Negative for rash.  Neurological:  Negative for speech difficulty, weakness, numbness and headaches.  Hematological:  Does not bruise/bleed easily.  Psychiatric/Behavioral:  Negative for confusion.     Physical Exam Updated Vital Signs BP (!) 154/91   Pulse 92   Temp 98.6 F (37 C) (Oral)   Resp (!) 24   Ht 1.524 m (5')   Wt 55.8 kg   SpO2 94%   BMI 24.02 kg/m  Physical Exam Vitals and nursing note reviewed.  Constitutional:      Appearance: Normal appearance. She is well-developed.  HENT:     Head: Atraumatic.     Nose: Nose normal.     Mouth/Throat:     Mouth: Mucous membranes are moist.  Eyes:     General: No scleral icterus.    Conjunctiva/sclera: Conjunctivae normal.     Pupils: Pupils are equal, round, and reactive to light.  Neck:     Vascular: No carotid bruit.     Trachea: No tracheal deviation.  Comments: Trachea midline. Thyroid not grossly enlarged or tender.  Cardiovascular:     Rate and Rhythm: Normal rate and regular rhythm.     Pulses: Normal pulses.     Heart sounds: Normal heart sounds. No murmur heard.    No friction rub. No gallop.  Pulmonary:     Effort: Pulmonary effort is normal. No respiratory distress.     Breath sounds: Normal breath sounds.  Abdominal:     General: Bowel sounds are normal. There is no distension.     Palpations: Abdomen is soft. There is no mass.     Tenderness: There is no abdominal tenderness.  Genitourinary:    Comments: No cva tenderness.  Musculoskeletal:        General: No  swelling or tenderness.     Cervical back: Normal range of motion and neck supple. No rigidity or tenderness. No muscular tenderness.     Right lower leg: No edema.     Left lower leg: No edema.  Skin:    General: Skin is warm and dry.     Findings: No rash.  Neurological:     Mental Status: She is alert.     Comments: Alert, speech normal. Motor/sens grossly intact bil.   Psychiatric:        Mood and Affect: Mood normal.     ED Results / Procedures / Treatments   Labs (all labs ordered are listed, but only abnormal results are displayed) Results for orders placed or performed during the hospital encounter of 04/25/23  CBC  Result Value Ref Range   WBC 8.1 4.0 - 10.5 K/uL   RBC 3.92 3.87 - 5.11 MIL/uL   Hemoglobin 13.5 12.0 - 15.0 g/dL   HCT 40.9 81.1 - 91.4 %   MCV 99.5 80.0 - 100.0 fL   MCH 34.4 (H) 26.0 - 34.0 pg   MCHC 34.6 30.0 - 36.0 g/dL   RDW 78.2 95.6 - 21.3 %   Platelets 420 (H) 150 - 400 K/uL   nRBC 0.0 0.0 - 0.2 %  Comprehensive metabolic panel  Result Value Ref Range   Sodium 133 (L) 135 - 145 mmol/L   Potassium 3.5 3.5 - 5.1 mmol/L   Chloride 97 (L) 98 - 111 mmol/L   CO2 21 (L) 22 - 32 mmol/L   Glucose, Bld 115 (H) 70 - 99 mg/dL   BUN 6 6 - 20 mg/dL   Creatinine, Ser 0.86 0.44 - 1.00 mg/dL   Calcium 8.9 8.9 - 57.8 mg/dL   Total Protein 6.8 6.5 - 8.1 g/dL   Albumin 3.8 3.5 - 5.0 g/dL   AST 26 15 - 41 U/L   ALT 22 0 - 44 U/L   Alkaline Phosphatase 61 38 - 126 U/L   Total Bilirubin 0.8 0.3 - 1.2 mg/dL   GFR, Estimated >46 >96 mL/min   Anion gap 15 5 - 15  TSH  Result Value Ref Range   TSH 1.562 0.350 - 4.500 uIU/mL  ECHOCARDIOGRAM COMPLETE  Result Value Ref Range   Weight 1,968 oz   Height 60 in   BP 125/85 mmHg   Single Plane A2C EF 65.9 %   Single Plane A4C EF 64.9 %   Calc EF 65.6 %   S' Lateral 2.40 cm   Area-P 1/2 5.27 cm2   Est EF 60 - 65%      EKG EKG Interpretation  Date/Time:  Sunday April 25 2023 08:52:30 EDT Ventricular Rate:   99 PR  Interval:  137 QRS Duration: 78 QT Interval:  336 QTC Calculation: 432 R Axis:   69 Text Interpretation: Sinus rhythm Confirmed by Cathren Laine ([id number]) on 04/25/2023 10:42:26 AM  Radiology ECHOCARDIOGRAM COMPLETE  Result Date: 04/25/2023    ECHOCARDIOGRAM REPORT   Patient Name:   Catherine Leonard Date of Exam: 04/25/2023 Medical Rec #:  [id number]      Height:       60.0 in Accession #:    [id number]     Weight:       123.0 lb Date of Birth:  07-16-1966       BSA:          1.518 m Patient Age:    57 years       BP:           158/85 mmHg Patient Gender: F              HR:           97 bpm. Exam Location:  Inpatient Procedure: 2D Echo Indications:    SVT  History:        Patient has no prior history of Echocardiogram examinations.                 Arrythmias:SVT.  Sonographer:    Sheralyn Boatman RDCS Referring Phys: [id number]ade IMPRESSIONS  1. Left ventricular ejection fraction, by estimation, is 60 to 65%. The left ventricle has normal function. The left ventricle has no regional wall motion abnormalities. Left ventricular diastolic parameters were normal.  2. Right ventricular systolic function is normal. The right ventricular size is normal.  3. The mitral valve is normal in structure. No evidence of mitral valve regurgitation. No evidence of mitral stenosis.  4. The aortic valve is normal in structure. Aortic valve regurgitation is not visualized. No aortic stenosis is present.  5. The inferior vena cava is normal in size with greater than 50% respiratory variability, suggesting right atrial pressure of 3 mmHg. FINDINGS  Left Ventricle: Left ventricular ejection fraction, by estimation, is 60 to 65%. The left ventricle has normal function. The left ventricle has no regional wall motion abnormalities. The left ventricular internal cavity size was normal in size. There is  no left ventricular hypertrophy. Left ventricular diastolic parameters were normal. Right Ventricle: The right ventricular size is  normal. No increase in right ventricular wall thickness. Right ventricular systolic function is normal. Left Atrium: Left atrial size was normal in size. Right Atrium: Right atrial size was normal in size. Pericardium: There is no evidence of pericardial effusion. Mitral Valve: The mitral valve is normal in structure. No evidence of mitral valve regurgitation. No evidence of mitral valve stenosis. Tricuspid Valve: The tricuspid valve is normal in structure. Tricuspid valve regurgitation is not demonstrated. No evidence of tricuspid stenosis. Aortic Valve: The aortic valve is normal in structure. Aortic valve regurgitation is not visualized. No aortic stenosis is present. Pulmonic Valve: The pulmonic valve was normal in structure. Pulmonic valve regurgitation is not visualized. No evidence of pulmonic stenosis. Aorta: The aortic root is normal in size and structure. Venous: The inferior vena cava is normal in size with greater than 50% respiratory variability, suggesting right atrial pressure of 3 mmHg. IAS/Shunts: No atrial level shunt detected by color flow Doppler.  LEFT VENTRICLE PLAX 2D LVIDd:         3.80 cm     Diastology LVIDs:         2.40 cm  LV e' medial:    7.29 cm/s LV PW:         1.10 cm     LV E/e' medial:  9.1 LV IVS:        1.10 cm     LV e' lateral:   5.60 cm/s LVOT diam:     2.10 cm     LV E/e' lateral: 11.8 LV SV:         56 LV SV Index:   37 LVOT Area:     3.46 cm  LV Volumes (MOD) LV vol d, MOD A2C: 55.2 ml LV vol d, MOD A4C: 39.0 ml LV vol s, MOD A2C: 18.8 ml LV vol s, MOD A4C: 13.7 ml LV SV MOD A2C:     36.4 ml LV SV MOD A4C:     39.0 ml LV SV MOD BP:      30.7 ml RIGHT VENTRICLE             IVC RV S prime:     14.00 cm/s  IVC diam: 1.50 cm TAPSE (M-mode): 1.6 cm LEFT ATRIUM           Index        RIGHT ATRIUM          Index LA diam:      1.90 cm 1.25 cm/m   RA Area:     8.67 cm LA Vol (A2C): 16.5 ml 10.87 ml/m  RA Volume:   15.30 ml 10.08 ml/m LA Vol (A4C): 11.4 ml 7.51 ml/m  AORTIC  VALVE LVOT Vmax:   106.00 cm/s LVOT Vmean:  68.000 cm/s LVOT VTI:    0.162 m  AORTA Ao Root diam: 2.70 cm Ao Asc diam:  3.50 cm MITRAL VALVE MV Area (PHT): 5.27 cm    SHUNTS MV Decel Time: 144 msec    Systemic VTI:  0.16 m MV E velocity: 66.30 cm/s  Systemic Diam: 2.10 cm MV A velocity: 86.20 cm/s MV E/A ratio:  0.77 Charlton Haws MD Electronically signed by Charlton Haws MD Signature Date/Time: 04/25/2023/10:41:51 AM    Final     Procedures Procedures    Medications Ordered in ED Medications  potassium chloride SA (KLOR-CON M) CR tablet 40 mEq (has no administration in time range)  metoprolol succinate (TOPROL-XL) 24 hr tablet 50 mg (has no administration in time range)    ED Course/ Medical Decision Making/ A&P                             Medical Decision Making Problems Addressed: Elevated blood pressure reading: acute illness or injury Lumbar degenerative disc disease: chronic illness or injury with exacerbation, progression, or side effects of treatment that poses a threat to life or bodily functions Narrow complex tachycardia: acute illness or injury with systemic symptoms that poses a threat to life or bodily functions SVT (supraventricular tachycardia): acute illness or injury with systemic symptoms that poses a threat to life or bodily functions  Amount and/or Complexity of Data Reviewed Independent Historian: EMS    Details: hx External Data Reviewed: notes. Labs: ordered. Decision-making details documented in ED Course. Radiology: ordered and independent interpretation performed. Decision-making details documented in ED Course. ECG/medicine tests: ordered and independent interpretation performed. Decision-making details documented in ED Course. Discussion of management or test interpretation with external provider(s): Cardiology, ortho  Risk Prescription drug management. Decision regarding hospitalization.   Iv ns. Continuous pulse ox and cardiac monitoring. Labs  ordered/sent. Imaging ordered.   Differential diagnosis includes SVT, afib/rvr, near syncope, etc. Dispo decision including potential need for admission considered - will get labs and imaging and reassess.   Reviewed nursing notes and prior charts for additional history. External reports reviewed. Additional history from: EMS.   Cardiac monitor: sinus rhythm, rate 70.  Labs reviewed/interpreted by me - wbc and hgb normal. K borderline low, kcl po.   Cardiology consulted, discussed pt with Dr Eden Emms. Dr Eden Emms indicates echo ok/normal, he has evaluated in ED, and indicates feels fine for pt to proceed with plan for back surgery tomorrow, recommends giving pt toprol 50 mg in ed, and rx for 50 mg a day, he will correspond with ortho/Dr Shon Baton.   ECHO reviewed/interpreted by me - EF normal (further specific report per cardiology interpretation).   Recheck pt, in nsr, no cp or sob, no symptoms.   Pt requests d/c, states ready to go.  I was able to reach [facility] on call, Dr Charlann Boxer - he will pass on word to Dr Shon Baton re todays event, and cardiology feeling that pt ok to proceed with surgery as planned.   Pt currently appears stable for d/c.   Return precautions provided.          Final Clinical Impression(s) / ED Diagnoses Final diagnoses:  None    Rx / DC Orders ED Discharge Orders     None         Cathren Laine, MD 04/25/23 1059

## 2023-04-25 NOTE — ED Triage Notes (Signed)
Pt bib [facility] from home where she was having an episode of shob, diaphoresis, chest pain, and dizziness. Upon ems arrival, pt was found with a heart rate of 233 and they could not get any bp. Pt was shocked at 100J and converted back to NSR. Pt arrives aox4, skin warm and dry, resp even and unlabored.

## 2023-04-26 ENCOUNTER — Encounter (HOSPITAL_COMMUNITY): Payer: Self-pay | Admitting: Orthopedic Surgery

## 2023-04-26 ENCOUNTER — Other Ambulatory Visit: Payer: Self-pay

## 2023-04-26 ENCOUNTER — Inpatient Hospital Stay (HOSPITAL_COMMUNITY): Payer: BC Managed Care – PPO | Admitting: Certified Registered"

## 2023-04-26 ENCOUNTER — Inpatient Hospital Stay (HOSPITAL_COMMUNITY): Payer: BC Managed Care – PPO

## 2023-04-26 ENCOUNTER — Encounter (HOSPITAL_COMMUNITY)
Admission: RE | Disposition: A | Discharge status: Home / Self Care | Payer: Self-pay | Source: Home / Self Care | Attending: Orthopedic Surgery

## 2023-04-26 ENCOUNTER — Inpatient Hospital Stay (HOSPITAL_COMMUNITY): Payer: BC Managed Care – PPO | Admitting: Physician Assistant

## 2023-04-26 ENCOUNTER — Inpatient Hospital Stay (HOSPITAL_COMMUNITY)
Admission: RE | Admit: 2023-04-26 | Discharge: 2023-04-29 | DRG: 454 | Disposition: A | Discharge status: Home / Self Care | Payer: BC Managed Care – PPO | Attending: Orthopedic Surgery | Admitting: Orthopedic Surgery

## 2023-04-26 DIAGNOSIS — M5116 Intervertebral disc disorders with radiculopathy, lumbar region: Secondary | ICD-10-CM | POA: Diagnosis present

## 2023-04-26 DIAGNOSIS — I471 Supraventricular tachycardia, unspecified: Secondary | ICD-10-CM | POA: Diagnosis not present

## 2023-04-26 DIAGNOSIS — Z981 Arthrodesis status: Principal | ICD-10-CM

## 2023-04-26 DIAGNOSIS — M48062 Spinal stenosis, lumbar region with neurogenic claudication: Principal | ICD-10-CM | POA: Diagnosis present

## 2023-04-26 DIAGNOSIS — F419 Anxiety disorder, unspecified: Secondary | ICD-10-CM | POA: Diagnosis present

## 2023-04-26 DIAGNOSIS — M4156 Other secondary scoliosis, lumbar region: Secondary | ICD-10-CM | POA: Diagnosis present

## 2023-04-26 DIAGNOSIS — Z635 Disruption of family by separation and divorce: Secondary | ICD-10-CM | POA: Diagnosis not present

## 2023-04-26 DIAGNOSIS — I959 Hypotension, unspecified: Secondary | ICD-10-CM | POA: Diagnosis not present

## 2023-04-26 DIAGNOSIS — E7800 Pure hypercholesterolemia, unspecified: Secondary | ICD-10-CM | POA: Diagnosis present

## 2023-04-26 DIAGNOSIS — Z720 Tobacco use: Secondary | ICD-10-CM

## 2023-04-26 DIAGNOSIS — Z79899 Other long term (current) drug therapy: Secondary | ICD-10-CM

## 2023-04-26 DIAGNOSIS — G8929 Other chronic pain: Secondary | ICD-10-CM | POA: Diagnosis present

## 2023-04-26 DIAGNOSIS — I951 Orthostatic hypotension: Secondary | ICD-10-CM | POA: Diagnosis not present

## 2023-04-26 HISTORY — PX: ANTERIOR LAT LUMBAR FUSION: SHX1168

## 2023-04-26 LAB — ABO/RH: ABO/RH(D): AB POS

## 2023-04-26 SURGERY — ANTERIOR LATERAL LUMBAR FUSION 2 LEVELS
Anesthesia: General

## 2023-04-26 MED ORDER — PHENOL 1.4 % MT LIQD: 1.0000 | OROMUCOSAL | Status: DC | PRN

## 2023-04-26 MED ORDER — AMISULPRIDE (ANTIEMETIC) 5 MG/2ML IV SOLN: 10.0000 mg | Freq: Once | INTRAVENOUS | Status: DC | PRN

## 2023-04-26 MED ORDER — ONDANSETRON HCL 4 MG/2ML IJ SOLN
INTRAMUSCULAR | Status: DC | PRN
  Administered 2023-04-26: 4 mg via INTRAVENOUS

## 2023-04-26 MED ORDER — THROMBIN 20000 UNITS EX SOLR: CUTANEOUS | Status: DC | PRN

## 2023-04-26 MED ORDER — LACTATED RINGERS IV SOLN: INTRAVENOUS | Status: DC

## 2023-04-26 MED ORDER — HYDROMORPHONE HCL 1 MG/ML IJ SOLN
1.0000 mg | INTRAMUSCULAR | Status: AC | PRN
  Administered 2023-04-26 – 2023-04-27 (×4): 1 mg via INTRAVENOUS
  Filled 2023-04-26 (×4): qty 1

## 2023-04-26 MED ORDER — MEPERIDINE HCL 25 MG/ML IJ SOLN: 6.2500 mg | INTRAMUSCULAR | Status: DC | PRN

## 2023-04-26 MED ORDER — DEXAMETHASONE SODIUM PHOSPHATE 10 MG/ML IJ SOLN
INTRAMUSCULAR | Status: AC
  Filled 2023-04-26: qty 1

## 2023-04-26 MED ORDER — CHLORHEXIDINE GLUCONATE 0.12 % MT SOLN
OROMUCOSAL | Status: AC
  Administered 2023-04-26: 15 mL via OROMUCOSAL
  Filled 2023-04-26: qty 15

## 2023-04-26 MED ORDER — BUPIVACAINE HCL (PF) 0.5 % IJ SOLN
INTRAMUSCULAR | Status: DC | PRN
  Administered 2023-04-26: 20 mL via PERINEURAL

## 2023-04-26 MED ORDER — SODIUM CHLORIDE 0.9% FLUSH
3.0000 mL | Freq: Two times a day (BID) | INTRAVENOUS | Status: DC
  Administered 2023-04-26 – 2023-04-27 (×2): 3 mL via INTRAVENOUS

## 2023-04-26 MED ORDER — THROMBIN 20000 UNITS EX SOLR
CUTANEOUS | Status: AC
  Filled 2023-04-26: qty 20000

## 2023-04-26 MED ORDER — SUCCINYLCHOLINE CHLORIDE 200 MG/10ML IV SOSY
PREFILLED_SYRINGE | INTRAVENOUS | Status: AC
  Filled 2023-04-26: qty 10

## 2023-04-26 MED ORDER — OXYCODONE HCL 5 MG PO TABS
10.0000 mg | ORAL_TABLET | ORAL | Status: DC | PRN
  Administered 2023-04-26 – 2023-04-28 (×9): 10 mg via ORAL
  Filled 2023-04-26 (×9): qty 2

## 2023-04-26 MED ORDER — METHOCARBAMOL 500 MG PO TABS
500.0000 mg | ORAL_TABLET | Freq: Four times a day (QID) | ORAL | Status: DC | PRN
  Administered 2023-04-26 – 2023-04-29 (×6): 500 mg via ORAL
  Filled 2023-04-26 (×6): qty 1

## 2023-04-26 MED ORDER — POLYETHYLENE GLYCOL 3350 17 G PO PACK: 17.0000 g | PACK | Freq: Every day | ORAL | Status: DC | PRN

## 2023-04-26 MED ORDER — ESMOLOL HCL 100 MG/10ML IV SOLN
INTRAVENOUS | Status: AC
  Filled 2023-04-26: qty 10

## 2023-04-26 MED ORDER — ACETAMINOPHEN 650 MG RE SUPP: 650.0000 mg | RECTAL | Status: DC | PRN

## 2023-04-26 MED ORDER — ONDANSETRON HCL 4 MG/2ML IJ SOLN
INTRAMUSCULAR | Status: AC
  Filled 2023-04-26: qty 2

## 2023-04-26 MED ORDER — HYDROMORPHONE HCL 1 MG/ML IJ SOLN
0.2500 mg | INTRAMUSCULAR | Status: DC | PRN
  Administered 2023-04-26 (×3): 0.5 mg via INTRAVENOUS

## 2023-04-26 MED ORDER — FENTANYL CITRATE (PF) 250 MCG/5ML IJ SOLN
INTRAMUSCULAR | Status: DC | PRN
  Administered 2023-04-26 (×5): 50 ug via INTRAVENOUS

## 2023-04-26 MED ORDER — PHENYLEPHRINE HCL-NACL 20-0.9 MG/250ML-% IV SOLN
INTRAVENOUS | Status: DC | PRN
  Administered 2023-04-26: 40 ug/min via INTRAVENOUS

## 2023-04-26 MED ORDER — HYDROMORPHONE HCL 1 MG/ML IJ SOLN
INTRAMUSCULAR | Status: AC
  Filled 2023-04-26: qty 1

## 2023-04-26 MED ORDER — METHOCARBAMOL 1000 MG/10ML IJ SOLN: 500.0000 mg | Freq: Four times a day (QID) | INTRAVENOUS | Status: DC | PRN

## 2023-04-26 MED ORDER — ACETAMINOPHEN 325 MG PO TABS
650.0000 mg | ORAL_TABLET | ORAL | Status: DC | PRN
  Administered 2023-04-26 – 2023-04-29 (×9): 650 mg via ORAL
  Filled 2023-04-26 (×9): qty 2

## 2023-04-26 MED ORDER — PROPOFOL 10 MG/ML IV BOLUS
INTRAVENOUS | Status: AC
  Filled 2023-04-26: qty 20

## 2023-04-26 MED ORDER — SODIUM CHLORIDE 0.9 % IV SOLN: 250.0000 mL | INTRAVENOUS | Status: DC

## 2023-04-26 MED ORDER — ONDANSETRON HCL 4 MG PO TABS: 4.0000 mg | ORAL_TABLET | Freq: Four times a day (QID) | ORAL | Status: DC | PRN

## 2023-04-26 MED ORDER — TRANEXAMIC ACID-NACL 1000-0.7 MG/100ML-% IV SOLN
1000.0000 mg | INTRAVENOUS | Status: AC
  Administered 2023-04-26: 1000 mg via INTRAVENOUS
  Filled 2023-04-26: qty 100

## 2023-04-26 MED ORDER — OXYCODONE HCL 5 MG PO TABS
5.0000 mg | ORAL_TABLET | ORAL | Status: DC | PRN
  Administered 2023-04-28 – 2023-04-29 (×5): 5 mg via ORAL
  Filled 2023-04-26 (×5): qty 1

## 2023-04-26 MED ORDER — PROPOFOL 1000 MG/100ML IV EMUL
INTRAVENOUS | Status: AC
  Filled 2023-04-26: qty 100

## 2023-04-26 MED ORDER — MAGNESIUM CITRATE PO SOLN
1.0000 | Freq: Once | ORAL | Status: AC | PRN
  Administered 2023-04-27: 1 via ORAL
  Filled 2023-04-26: qty 296

## 2023-04-26 MED ORDER — ALPRAZOLAM 0.5 MG PO TABS
0.5000 mg | ORAL_TABLET | Freq: Two times a day (BID) | ORAL | Status: DC | PRN
  Administered 2023-04-26 – 2023-04-27 (×2): 0.5 mg via ORAL
  Filled 2023-04-26 (×2): qty 1

## 2023-04-26 MED ORDER — HYDROMORPHONE HCL 1 MG/ML IJ SOLN
0.5000 mg | INTRAMUSCULAR | Status: DC | PRN
  Administered 2023-04-26: 0.5 mg via INTRAVENOUS

## 2023-04-26 MED ORDER — BUPIVACAINE LIPOSOME 1.3 % IJ SUSP
INTRAMUSCULAR | Status: DC | PRN
  Administered 2023-04-26: 10 mL via PERINEURAL

## 2023-04-26 MED ORDER — PROPOFOL 10 MG/ML IV BOLUS
INTRAVENOUS | Status: DC | PRN
  Administered 2023-04-26: 50 mg via INTRAVENOUS
  Administered 2023-04-26: 150 mg via INTRAVENOUS
  Administered 2023-04-26: 100 ug/kg/min via INTRAVENOUS

## 2023-04-26 MED ORDER — MIDAZOLAM HCL 2 MG/2ML IJ SOLN
INTRAMUSCULAR | Status: AC
  Administered 2023-04-26: 2 mg via INTRAVENOUS
  Filled 2023-04-26: qty 2

## 2023-04-26 MED ORDER — CEFAZOLIN SODIUM-DEXTROSE 1-4 GM/50ML-% IV SOLN
1.0000 g | Freq: Three times a day (TID) | INTRAVENOUS | Status: AC
  Administered 2023-04-26 – 2023-04-27 (×2): 1 g via INTRAVENOUS
  Filled 2023-04-26 (×2): qty 50

## 2023-04-26 MED ORDER — BUPIVACAINE-EPINEPHRINE (PF) 0.25% -1:200000 IJ SOLN
INTRAMUSCULAR | Status: AC
  Filled 2023-04-26: qty 30

## 2023-04-26 MED ORDER — MIDAZOLAM HCL 2 MG/2ML IJ SOLN
INTRAMUSCULAR | Status: AC
  Filled 2023-04-26: qty 2

## 2023-04-26 MED ORDER — HYDROMORPHONE HCL 1 MG/ML IJ SOLN
INTRAMUSCULAR | Status: AC
  Administered 2023-04-26: 0.5 mg via INTRAVENOUS
  Filled 2023-04-26: qty 1

## 2023-04-26 MED ORDER — 0.9 % SODIUM CHLORIDE (POUR BTL) OPTIME
TOPICAL | Status: DC | PRN
  Administered 2023-04-26: 1000 mL

## 2023-04-26 MED ORDER — FENTANYL CITRATE (PF) 100 MCG/2ML IJ SOLN: 100.0000 ug | Freq: Once | INTRAMUSCULAR | Status: AC

## 2023-04-26 MED ORDER — ONDANSETRON HCL 4 MG/2ML IJ SOLN
4.0000 mg | Freq: Four times a day (QID) | INTRAMUSCULAR | Status: DC | PRN
  Administered 2023-04-27: 4 mg via INTRAVENOUS

## 2023-04-26 MED ORDER — DEXAMETHASONE SODIUM PHOSPHATE 10 MG/ML IJ SOLN
INTRAMUSCULAR | Status: DC | PRN
  Administered 2023-04-26: 10 mg via INTRAVENOUS

## 2023-04-26 MED ORDER — CEFAZOLIN SODIUM-DEXTROSE 2-4 GM/100ML-% IV SOLN
2.0000 g | INTRAVENOUS | Status: AC
  Administered 2023-04-26: 2 g via INTRAVENOUS
  Filled 2023-04-26: qty 100

## 2023-04-26 MED ORDER — LABETALOL HCL 5 MG/ML IV SOLN
5.0000 mg | INTRAVENOUS | Status: DC | PRN
  Administered 2023-04-26 (×2): 5 mg via INTRAVENOUS

## 2023-04-26 MED ORDER — FENTANYL CITRATE (PF) 100 MCG/2ML IJ SOLN
INTRAMUSCULAR | Status: AC
  Administered 2023-04-26: 100 ug via INTRAVENOUS
  Filled 2023-04-26: qty 2

## 2023-04-26 MED ORDER — PROMETHAZINE HCL 25 MG/ML IJ SOLN: 6.2500 mg | INTRAMUSCULAR | Status: DC | PRN

## 2023-04-26 MED ORDER — OXYCODONE HCL 5 MG PO TABS: 5.0000 mg | ORAL_TABLET | Freq: Once | ORAL | Status: DC | PRN

## 2023-04-26 MED ORDER — ROCURONIUM BROMIDE 10 MG/ML (PF) SYRINGE
PREFILLED_SYRINGE | INTRAVENOUS | Status: AC
  Filled 2023-04-26: qty 10

## 2023-04-26 MED ORDER — MIDAZOLAM HCL 2 MG/2ML IJ SOLN
INTRAMUSCULAR | Status: DC | PRN
  Administered 2023-04-26: 2 mg via INTRAVENOUS

## 2023-04-26 MED ORDER — GABAPENTIN 300 MG PO CAPS
600.0000 mg | ORAL_CAPSULE | Freq: Three times a day (TID) | ORAL | Status: DC
  Administered 2023-04-27 – 2023-04-29 (×6): 600 mg via ORAL
  Filled 2023-04-26 (×6): qty 2

## 2023-04-26 MED ORDER — SODIUM CHLORIDE 0.9% FLUSH: 3.0000 mL | INTRAVENOUS | Status: DC | PRN

## 2023-04-26 MED ORDER — ORAL CARE MOUTH RINSE: 15.0000 mL | Freq: Once | OROMUCOSAL | Status: AC

## 2023-04-26 MED ORDER — MIDAZOLAM HCL 2 MG/2ML IJ SOLN: 2.0000 mg | Freq: Once | INTRAMUSCULAR | Status: AC

## 2023-04-26 MED ORDER — LIDOCAINE 2% (20 MG/ML) 5 ML SYRINGE
INTRAMUSCULAR | Status: DC | PRN
  Administered 2023-04-26: 60 mg via INTRAVENOUS

## 2023-04-26 MED ORDER — FENTANYL CITRATE (PF) 250 MCG/5ML IJ SOLN
INTRAMUSCULAR | Status: AC
  Filled 2023-04-26: qty 5

## 2023-04-26 MED ORDER — BUPIVACAINE-EPINEPHRINE 0.25% -1:200000 IJ SOLN
INTRAMUSCULAR | Status: DC | PRN
  Administered 2023-04-26: 20 mL

## 2023-04-26 MED ORDER — SUCCINYLCHOLINE CHLORIDE 200 MG/10ML IV SOSY
PREFILLED_SYRINGE | INTRAVENOUS | Status: DC | PRN
  Administered 2023-04-26: 120 mg via INTRAVENOUS

## 2023-04-26 MED ORDER — METOPROLOL SUCCINATE ER 50 MG PO TB24
50.0000 mg | ORAL_TABLET | Freq: Every day | ORAL | Status: DC
  Administered 2023-04-27 – 2023-04-28 (×2): 50 mg via ORAL
  Filled 2023-04-26 (×2): qty 1

## 2023-04-26 MED ORDER — OXYCODONE HCL 5 MG/5ML PO SOLN: 5.0000 mg | Freq: Once | ORAL | Status: DC | PRN

## 2023-04-26 MED ORDER — MENTHOL 3 MG MT LOZG: 1.0000 | LOZENGE | OROMUCOSAL | Status: DC | PRN

## 2023-04-26 MED ORDER — SODIUM CHLORIDE 0.9 % IV SOLN
0.0125 ug/kg/min | Freq: Once | INTRAVENOUS | Status: AC
  Administered 2023-04-26: .15 ug/kg/min via INTRAVENOUS
  Filled 2023-04-26 (×2): qty 2000

## 2023-04-26 MED ORDER — CHLORHEXIDINE GLUCONATE 0.12 % MT SOLN: 15.0000 mL | Freq: Once | OROMUCOSAL | Status: AC

## 2023-04-26 MED ORDER — LABETALOL HCL 5 MG/ML IV SOLN
INTRAVENOUS | Status: AC
  Filled 2023-04-26: qty 4

## 2023-04-26 MED ORDER — LIDOCAINE 2% (20 MG/ML) 5 ML SYRINGE
INTRAMUSCULAR | Status: AC
  Filled 2023-04-26: qty 5

## 2023-04-26 SURGICAL SUPPLY — 66 items
AGENT HMST KT MTR STRL THRMB (HEMOSTASIS)
BAG COUNTER SPONGE SURGICOUNT (BAG) ×1 IMPLANT
BAG SPNG CNTER NS LX DISP (BAG) ×1
BLADE CLIPPER SURG (BLADE) IMPLANT
BLADE SURG 10 STRL SS (BLADE) ×1 IMPLANT
CATH FOLEY 2WAY SLVR 30CC 16FR (CATHETERS) ×1 IMPLANT
CLSR STERI-STRIP ANTIMIC 1/2X4 (GAUZE/BANDAGES/DRESSINGS) ×1 IMPLANT
COVER SURGICAL LIGHT HANDLE (MISCELLANEOUS) ×1 IMPLANT
DRAIN CHANNEL 15F RND FF W/TCR (WOUND CARE) IMPLANT
DRAPE C-ARM 42X72 X-RAY (DRAPES) ×2 IMPLANT
DRAPE C-ARMOR (DRAPES) ×1 IMPLANT
DRAPE INCISE IOBAN 66X45 STRL (DRAPES) IMPLANT
DRAPE U-SHAPE 47X51 STRL (DRAPES) ×2 IMPLANT
DRSG OPSITE POSTOP 3X4 (GAUZE/BANDAGES/DRESSINGS) IMPLANT
DRSG OPSITE POSTOP 4X6 (GAUZE/BANDAGES/DRESSINGS) ×1 IMPLANT
DURAPREP 26ML APPLICATOR (WOUND CARE) ×1 IMPLANT
ELECT BLADE 4.0 EZ CLEAN MEGAD (MISCELLANEOUS) ×1
ELECT EZ MEGADYNE CLEAN NT 3X6 (ELECTRODE) IMPLANT
ELECT PENCIL ROCKER SW 15FT (MISCELLANEOUS) ×1 IMPLANT
ELECT REM PT RETURN 9FT ADLT (ELECTROSURGICAL) ×1
ELECTRODE BLDE 4.0 EZ CLN MEGD (MISCELLANEOUS) ×1 IMPLANT
ELECTRODE REM PT RTRN 9FT ADLT (ELECTROSURGICAL) ×1 IMPLANT
FUNNEL BONE TUBE 6001 GL (ORTHOPEDIC DISPOSABLE SUPPLIES) IMPLANT
GLOVE BIO SURGEON STRL SZ 6.5 (GLOVE) ×1 IMPLANT
GLOVE BIOGEL PI IND STRL 6.5 (GLOVE) ×1 IMPLANT
GLOVE BIOGEL PI IND STRL 8.5 (GLOVE) ×1 IMPLANT
GLOVE SS BIOGEL STRL SZ 8.5 (GLOVE) ×1 IMPLANT
GOWN STRL REUS W/ TWL LRG LVL3 (GOWN DISPOSABLE) ×1 IMPLANT
GOWN STRL REUS W/ TWL XL LVL3 (GOWN DISPOSABLE) IMPLANT
GOWN STRL REUS W/TWL 2XL LVL3 (GOWN DISPOSABLE) ×2 IMPLANT
GOWN STRL REUS W/TWL LRG LVL3 (GOWN DISPOSABLE) ×1
GOWN STRL REUS W/TWL XL LVL3 (GOWN DISPOSABLE)
KIT BASIN OR (CUSTOM PROCEDURE TRAY) ×1 IMPLANT
KIT DILATOR XLIF 5 (KITS) IMPLANT
KIT SURGICAL ACCESS MAXCESS 4 (KITS) IMPLANT
KIT TURNOVER KIT B (KITS) ×1 IMPLANT
MIX DBX 10CC 35% BONE (Bone Implant) IMPLANT
MODULE NVM5 NEXT GEN EMG (NEUROSURGERY SUPPLIES) IMPLANT
NDL 22X1.5 STRL (OR ONLY) (MISCELLANEOUS) ×1 IMPLANT
NDL SPNL 18GX3.5 QUINCKE PK (NEEDLE) ×1 IMPLANT
NEEDLE 22X1.5 STRL (OR ONLY) (MISCELLANEOUS) ×1 IMPLANT
NEEDLE SPNL 18GX3.5 QUINCKE PK (NEEDLE) ×1 IMPLANT
NS IRRIG 1000ML POUR BTL (IV SOLUTION) ×1 IMPLANT
PACK LAMINECTOMY ORTHO (CUSTOM PROCEDURE TRAY) ×1 IMPLANT
PACK UNIVERSAL I (CUSTOM PROCEDURE TRAY) ×1 IMPLANT
PAD ARMBOARD 7.5X6 YLW CONV (MISCELLANEOUS) ×2 IMPLANT
PUTTY BONE DBX 2.5 MIS (Bone Implant) IMPLANT
PUTTY BONE DBX 5CC MIX (Putty) IMPLANT
SPACER RISE 18X45MM 7-14MM (Spacer) IMPLANT
SPONGE SURGIFOAM ABS GEL 100 (HEMOSTASIS) IMPLANT
SPONGE T-LAP 4X18 ~~LOC~~+RFID (SPONGE) ×1 IMPLANT
STAPLER VISISTAT 35W (STAPLE) ×1 IMPLANT
SURGIFLO W/THROMBIN 8M KIT (HEMOSTASIS) IMPLANT
SUT BONE WAX W31G (SUTURE) IMPLANT
SUT MNCRL AB 3-0 PS2 27 (SUTURE) ×1 IMPLANT
SUT MNCRL+ AB 3-0 CT1 36 (SUTURE) IMPLANT
SUT STRATAFIX 1PDS 45CM VIOLET (SUTURE) IMPLANT
SUT VIC AB 1 CT1 18XCR BRD 8 (SUTURE) ×2 IMPLANT
SUT VIC AB 1 CT1 8-18 (SUTURE) ×1
SUT VIC AB 2-0 CT1 18 (SUTURE) ×1 IMPLANT
SYR BULB IRRIG 60ML STRL (SYRINGE) ×1 IMPLANT
SYR CONTROL 10ML LL (SYRINGE) IMPLANT
TAPE CLOTH 4X10 WHT NS (GAUZE/BANDAGES/DRESSINGS) ×1 IMPLANT
TOWEL GREEN STERILE (TOWEL DISPOSABLE) ×1 IMPLANT
TOWEL GREEN STERILE FF (TOWEL DISPOSABLE) ×1 IMPLANT
WATER STERILE IRR 1000ML POUR (IV SOLUTION) IMPLANT

## 2023-04-26 NOTE — H&P (Signed)
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 Despite conservative management her quality of life is continued to deteriorate. as result of the failure to improve with conservative management and or structural abnormality we have elected to move forward with a staged anterior posterior L2-4 instrumented fusion and possible decompression.  Past Medical History:  Diagnosis Date   Ambulates with cane    and wheelchair r/t back pain   Anxiety    Arthritis    Chronic hyponatremia    Hip pain    bilateral, in CE   Pneumonia    x 1 at age 1   Pure hypercholesterolemia     No Known Allergies  No current facility-administered medications on file prior to encounter.   No current outpatient medications on file prior to encounter.    Physical Exam: Vitals:   04/26/23 0830  BP: (!) 160/99  Pulse: 85  Resp: 18  Temp: 98.2 F (36.8 C)  SpO2: 99%   Body mass index is 24.03 kg/m. Clinical exam: Catherine Leonard is a pleasant individual, who appears younger than their stated age.  She is alert and orientated 3.  No shortness of breath, chest pain.  Patient denies any current chest discomfort.  Patient has a regular rate and rhythm.  No rubs gallops murmurs.  Abdomen is soft and non-tender, negative loss of bowel and bladder control, no rebound tenderness.  Negative: skin lesions abrasions contusions  Peripheral pulses: 2+ peripheral pulses bilaterally. LE compartments are: Soft and nontender.  Gait pattern: Altered gait pattern due to significant back pain. In the standing position she has an obvious scoliosis of the lumbar spine with a lateral shift to the left of her torso. Slight pelvic obliquity is noted. Slight shoulder obliquity is noted. Negative Romberg's test.  Assistive devices: None  Neuro: 5/5 motor strength in the lower extremity bilaterally. Negative Babinski test, no clonus, negative straight  leg raise test. Positive dysesthesias in the posterior and lateral thigh and groin region. Negative nerve root tension signs in the lower extremity. 1+ deep tendon reflexes.  Musculoskeletal: Significant back pain with palpation and range of motion. Obvious asymmetrical fold on the left side with significant pain with left lateral bending relief with right lateral bending. Positive scapular obliquity consistent with rotational deformity of scoliosis. No SI joint pain.  Imaging: Multiple x-rays of the thoracolumbar spine including scoliosis views, lateral bending films, and 3 view lumbar x-rays were taken today in the office. Advanced degenerative disc disease is noted at L2-3 and L3-4. Loss of normal lordosis with a flatback deformity is noted. On lateral bending films patient has correction of her deformity with bending to the right. Slight increase in the lateral listhesis at L4-3 is noted with left lateral bending. Rotational deformity does correct with the right lateral bending field maximum curvature is noted L2-3 and L3-4.  Lumbar MRI: completed on 01/20/2023. No fractures seen. Perineural cyst involving the sacral canal is noted with no expansion. No suspicious marrow signal change. Significant endplate edema and facet arthrosis at L2-3 and L3-4 with advanced degenerative disc disease. Thoracolumbar junctional dextroscoliosis with mid lumbar deformity and moderate levoscoliosis. Grade 1 anterior listhesis at L3-4 is also noted with severe central stenosis and complete ablation of the CSF signal.    Image: ECHOCARDIOGRAM COMPLETE  Result Date: 04/25/2023    ECHOCARDIOGRAM REPORT   Patient Name:   Catherine Leonard Date of Exam: 04/25/2023 Medical Rec #:  [id number]      Height:       60.0 in Accession #:    [id number]     Weight:       123.0 lb Date of Birth:  1966-09-04       BSA:          1.518 m Patient Age:    57 years       BP:           158/85 mmHg Patient Gender: F              HR:           97  bpm. Exam Location:  Inpatient Procedure: 2D Echo Indications:    SVT  History:        Patient has no prior history of Echocardiogram examinations.                 Arrythmias:SVT.  Sonographer:    Sheralyn Boatman RDCS Referring Phys: [id number] [address] Stade IMPRESSIONS  1. Left ventricular ejection fraction, by estimation, is 60 to 65%. The left ventricle has normal function. The left ventricle has no regional wall motion abnormalities. Left ventricular diastolic parameters were normal.  2. Right ventricular systolic function is normal. The right ventricular size is normal.  3. The mitral valve is normal in structure. No evidence of mitral valve regurgitation. No evidence of mitral stenosis.  4. The aortic valve is normal in structure. Aortic valve regurgitation is not visualized. No aortic stenosis is present.  5. The inferior vena cava is normal in size with greater than 50% respiratory variability, suggesting right atrial pressure of 3 mmHg. FINDINGS  Left Ventricle: Left ventricular ejection fraction, by estimation, is 60 to 65%. The left ventricle has normal function. The left ventricle has no regional wall motion abnormalities. The left ventricular internal cavity size was normal in size. There is  no left ventricular hypertrophy. Left ventricular diastolic parameters were normal. Right Ventricle: The right ventricular size is normal. No increase in right ventricular wall thickness. Right ventricular systolic function is normal. Left Atrium: Left atrial size was normal in size. Right Atrium: Right atrial size was normal in size. Pericardium: There is no evidence of pericardial effusion. Mitral Valve: The mitral valve is normal in structure. No evidence of mitral valve regurgitation. No evidence of mitral valve stenosis. Tricuspid Valve: The tricuspid valve is normal in structure. Tricuspid valve regurgitation is not demonstrated. No evidence of tricuspid stenosis. Aortic Valve: The aortic valve is normal in structure.  Aortic valve regurgitation is not visualized. No aortic stenosis is present. Pulmonic Valve: The pulmonic valve was normal in structure. Pulmonic valve regurgitation is not visualized. No evidence of pulmonic stenosis. Aorta: The aortic root is normal in size and structure. Venous: The inferior vena cava is normal in size with greater than 50% respiratory variability, suggesting right atrial pressure of 3 mmHg. IAS/Shunts: No atrial level shunt detected by color flow Doppler.  LEFT VENTRICLE PLAX 2D LVIDd:         3.80 cm     Diastology LVIDs:         2.40 cm     LV e' medial:    7.29 cm/s LV PW:         1.10 cm     LV E/e' medial:  9.1 LV IVS:        1.10 cm     LV e' lateral:   5.60 cm/s LVOT diam:  2.10 cm     LV E/e' lateral: 11.8 LV SV:         56 LV SV Index:   37 LVOT Area:     3.46 cm  LV Volumes (MOD) LV vol d, MOD A2C: 55.2 ml LV vol d, MOD A4C: 39.0 ml LV vol s, MOD A2C: 18.8 ml LV vol s, MOD A4C: 13.7 ml LV SV MOD A2C:     36.4 ml LV SV MOD A4C:     39.0 ml LV SV MOD BP:      30.7 ml RIGHT VENTRICLE             IVC RV S prime:     14.00 cm/s  IVC diam: 1.50 cm TAPSE (M-mode): 1.6 cm LEFT ATRIUM           Index        RIGHT ATRIUM          Index LA diam:      1.90 cm 1.25 cm/m   RA Area:     8.67 cm LA Vol (A2C): 16.5 ml 10.87 ml/m  RA Volume:   15.30 ml 10.08 ml/m LA Vol (A4C): 11.4 ml 7.51 ml/m  AORTIC VALVE LVOT Vmax:   106.00 cm/s LVOT Vmean:  68.000 cm/s LVOT VTI:    0.162 m  AORTA Ao Root diam: 2.70 cm Ao Asc diam:  3.50 cm MITRAL VALVE MV Area (PHT): 5.27 cm    SHUNTS MV Decel Time: 144 msec    Systemic VTI:  0.16 m MV E velocity: 66.30 cm/s  Systemic Diam: 2.10 cm MV A velocity: 86.20 cm/s MV E/A ratio:  0.77 Charlton Haws MD Electronically signed by Charlton Haws MD Signature Date/Time: 04/25/2023/10:41:51 AM    Final     A/P: Summary: Catherine Leonard is a very pleasant 57 year old woman with significant back buttock and bilateral lower extremity pain for the last 6 to 12 months. Despite  conservative treatment her quality of life is continued to deteriorate. Patient's clinical presentation is consistent with lumbar spinal stenosis with neurogenic claudication. MRI confirms severe spinal stenosis at L3-4 which is most likely the area of maximal neural compression causing the bilateral lower extremity dysesthesias. However she does have significant deformity with scoliosis and lateral listhesis. While she does have thoracolumbar curve I think the majority of her pathology is coming from the L2-3 and L3-4 level. In an extended conversation with the patient and her husband we have discussed treatment options. We could address the entire thoracolumbar curve with a very extensive surgery for treat the acute problem which I believe is the L2-3 L3-4 level. At this point her major issue is the severe acute pain she has had these last 6 to 12 months. I believe the spinal stenosis and degenerative disease with lateral listhesis is the main issue. To address her primary pathology I recommended a two-level XLIF L2-3 L3-4 with posterior supplemental pedicle screw fixation and possible additional decompression if needed. I have gone over the surgical procedure in great detail including the risks, benefits, and alternatives. All of their questions were encouraged. OLIF/XLIF risks, benefits of surgery were reviewed with the patient. These include: infection, bleeding, death, stroke, paralysis, ongoing or worse pain, need for additional surgery, injury to the lumbar plexus resulting in hip flexor weakness and difficulty walking without assistive devices. Adjacent segment degenerative disease, need for additional surgery including fusing other levels, leak of spinal fluid, Nonunion, hardware failure, breakage, or mal-position. Deep venous thrombosis (DVT) requiring additional  treatment such as filter, and/or medications. Injury to abdominal contents, loss in bowel and bladder control. Risks and benefits of spinal  fusion: Infection, bleeding, death, stroke, paralysis, ongoing or worse pain, need for additional surgery, nonunion, leak of spinal fluid, adjacent segment degeneration requiring additional fusion surgery, Injury to abdominal vessels that can require anterior surgery to stop bleeding. Malposition of the cage and/or pedicle screws that could require additional surgery. Loss of bowel and bladder control. Postoperative hematoma causing neurologic compression that could require urgent or emergent re-operation. Risks and benefits of lumbar decompression/discectomy: Infection, bleeding, death, stroke, paralysis, ongoing or worse pain, need for additional surgery, leak of spinal fluid, adjacent segment degeneration requiring additional surgery, post-operative hematoma formation that can result in neurological compromise and the need for urgent/emergent re-operation. Loss in bowel and bladder control. Injury to major vessels that could result in the need for urgent abdominal surgery to stop bleeding. Risk of deep venous thrombosis (DVT) and the need for additional treatment.  Addendum: Patient was seen in the emergency room with SVT.  Appropriate workup was performed by the cardiologist.  I spoke with Dr. Eden Emms at length and he confirms to me that there were no contraindications to moving forward with a 2-day anterior posterior spinal fusion.  If the patient develops any additional SVT he recommended appropriate treatment.  This was clearly outlined in his consultation note.  I reviewed this with the anesthesiologist as well and all of their concerns were addressed.  We will plan on moving forward with surgery as originally planned.

## 2023-04-26 NOTE — Op Note (Signed)
OPERATIVE REPORT  DATE OF SURGERY: 04/26/2023  PATIENT NAME:  Catherine Leonard MRN: [id number] DOB: Sep 29, 1966  PCP: Lezlie Lye, FNP  PRE-OPERATIVE DIAGNOSIS: Degenerative scoliosis with spinal stenosis and radiculopathy  POST-OPERATIVE DIAGNOSIS: Same  PROCEDURE:   Lateral interbody fusion L2-4 with correction of deformity.  SURGEON:  Venita Lick, MD  PHYSICIAN ASSISTANT: None  ANESTHESIA:   General  EBL: 50 ml   Complications: None  Implants: Globus expandable intervertebral cage.  L2-3: 7 x 18 x 45.  Expanded to final height of 11 mm.  L3-4: 7 x 18 x 45.  Expanded to final height of 12.5.  Neuromonitoring: No adverse EMG activity noted.  Graft: DBX mix  BRIEF HISTORY: Catherine Leonard is a 57 y.o. female who has had significant back buttock and intermittent neuropathic leg pain for some time.  Imaging demonstrated significant degenerative scoliosis with asymmetrical collapse of the discs, lateral listhesis at L2-3, and a significant curvature.  As result of the failure of conservative management to improve her quality of life we elected to move forward with surgery.  All appropriate risks, benefits, and alternatives to surgery were discussed with the patient and her husband and consent was obtained.  PROCEDURE DETAILS: Patient was brought into the operating room and was properly positioned on the operating room table.  After induction with general anesthesia the patient was endotracheally intubated.  A timeout was taken to confirm all important data: including patient, procedure, and the level. Teds, SCD's were applied.   A Foley was inserted by the nurse and the representative placed all appropriate monitors for EMG monitoring.  Patient was then turned into the lateral decubitus position (left side).  An axillary roll was placed and all bony prominences well-padded.  Using fluoroscopy I rotated the patient until she was properly seated.  I had an excellent view of the L3-4  level.  Due to the rotational deformity secondary to her scoliosis it was slightly less than ideal for L2-3.  However with the patient properly situated I secured her with tape to the bed so she would not move.  I then flexed the bed in order to reduce the scoliosis.  Based on her preoperative backbend films I was able to prove the scoliotic deformity.  I then identified the L3 vertebral body and marked out the anterior and posterior portion of my incision.  The flank and back was then prepped and draped in a standard fashion.  The incision site was infiltrated with quarter percent Marcaine with epinephrine.  I made a lateral incision and sharply dissected down to the external bleak.  I then made a counterincision approximately 1 fingerbreadth posteriorly and bluntly dissected down to the retroperitoneum.  I entered into the retroperitoneum and mobilized the adipose tissue and then dissected through the external and internal obliques until I could visualize the retroperitoneum.  I then placed the first liter onto the L2-3 disc space.  I confirmed satisfactory position with fluoroscopy.  I then secured this dilator into the disc with the guidepin.  I then circumferentially stimulated the distractor to ensure I was not traumatizing the plexus.  I then used the next 2 sequential dilators again circumferentially stimulating confirming satisfactory position.  The working retractor was then placed and the dilators were removed.  I then repositioned the dilator moving it posteriorly until I was in the posterior third of the disc space.  I then locked it in place.  X-rays confirm satisfactory positioning of the cage.  I then stimulated  behind each of the blades and slightly inferior to the blades confirming I was not traumatizing the plexus.  The posterior trocar blade was then inserted to secure the posterior plate to the disc space.  With the lateral aspect of the disc now perfectly exposed I proceeded with the  discectomy.  An annulotomy was performed with a 10 blade scalpel and I began removing the disc material.  The Cobb elevator was then placed on the endplate of L3 and advanced across to the contralateral annulus.  I then released this.  I then repeated this by running the Cobb elevator along the endplate of L2.  The box osteotome was then used to remove the bulk of the disc material.  I then used curettes to continue my discectomy.  Trialing implant was then inserted and expanded.  I had excellent overall purchase and restored the foraminal volume and corrected the scoliotic deformity.  I removed the trial and then rasped the endplates until I assured I had bleeding subchondral bone.  The 7 x 45 mm length cage was packed with DBX and then malleted into position.  I then expanded it to a final height of 11 mm.  I then remove the inserting device and backfilled the cage with additional DBX until it was full.  Imaging at this point demonstrated satisfactory positioning of the cage in both the AP and lateral planes.  At this point the wound was then copiously irrigated with normal saline and hemostasis was obtained using bipolar cautery and Floseal.  The posterior trocar blade was removed and the retractor system was removed from the wound.  I then repositioned my initial dilator over the L3-4 disc space.  Once I confirmed satisfactory position I secured it with a guidepin.  Using the same technique that I did use of L2-3 I dilated and then placed the working retractor.  I then repositioned the retractor sliding the psoas muscle posteriorly until I was in the posterior third of the disc space.  I confirmed satisfactory positioning with fluoroscopy.  I then stimulated to confirm I was not traumatizing the plexus.  The posterior trocar was then advanced to secure the plate into the disc space.  I could now visualize the lateral aspect of the L3-4 disc space.  Using the same technique I used at L2-3 I performed the  exact same discectomy.  I ensured that I released the contralateral annulus and I removed all of the disc material and expose bleeding subchondral bone.  I used the trialing device and then obtained the implant.  The implant was advanced and then expanded to a final height of 12.5 mm.  I then backfilled the cage with DBX mix.  I remove the inserting device irrigated the wound and make sure that hemostasis with bipolar electrocautery and Floseal.  I then removed the trocar.  X-rays were taken which demonstrated satisfactory positioning of the implants in both the AP and lateral planes.  There were no retained surgical instruments in the field.  After reirrigating both wounds I closed the deep fascia of both wounds.  The lateral wound I used a #1 strata fix and on the small posterior lateral I used a #1 Vicryl.  I then used a layer of interrupted 2-0 Vicryl sutures and finally a 3-0 Monocryl for the skin.  Steri-Strips and dry dressings were then applied.  The patient was then placed supine on the operating room table and a AP x-ray of the thoracolumbar spine was taken.  It  demonstrated satisfactory positioning of the 2 intervertebral cages as well as significant improvement in her coronal alignment of her spine.  I was able to correct the scoliosis and improve her overall alignment.  The patient was now extubated and transferred the PACU without incident.  The end of the case all needle sponge counts were correct.  The patient will be admitted to the hospital and return to the OR tomorrow to have posterior pedicle screw supplementation and bone grafting to aid in her fusion.  Venita Lick, MD 04/26/2023 3:37 PM

## 2023-04-26 NOTE — Progress Notes (Signed)
Lateral exposure

## 2023-04-26 NOTE — Anesthesia Postprocedure Evaluation (Signed)
Anesthesia Post Note  Patient: Dyana S Domingos  Procedure(s) Performed: Extreme lateral interbody fusion (XLIF) L2-4     Patient location during evaluation: PACU Anesthesia Type: General Level of consciousness: awake and alert, patient cooperative and oriented Pain management: pain level controlled Vital Signs Assessment: post-procedure vital signs reviewed and stable Respiratory status: spontaneous breathing, nonlabored ventilation and respiratory function stable Cardiovascular status: blood pressure returned to baseline and stable Postop Assessment: no apparent nausea or vomiting Anesthetic complications: no   No notable events documented.  Last Vitals:  Vitals:   04/26/23 1738 04/26/23 1745  BP: (!) 159/100 (!) 131/97  Pulse: 79 83  Resp: 11 17  Temp:    SpO2: 91% 100%    Last Pain:  Vitals:   04/26/23 1745  PainSc: 1                  Kiyoshi Schaab,E. Kalub Morillo

## 2023-04-26 NOTE — Anesthesia Procedure Notes (Signed)
Procedure Name: Intubation Date/Time: 04/26/2023 12:23 PM  Performed by: Brynda Peon, CRNAPre-anesthesia Checklist: Patient identified, Emergency Drugs available, Suction available, Patient being monitored and Timeout performed Patient Re-evaluated:Patient Re-evaluated prior to induction Oxygen Delivery Method: Circle system utilized Preoxygenation: Pre-oxygenation with 100% oxygen Induction Type: IV induction Ventilation: Mask ventilation without difficulty Laryngoscope Size: Miller and 2 Grade View: Grade II Tube type: Oral Tube size: 7.5 mm Number of attempts: 1 Airway Equipment and Method: Stylet Placement Confirmation: ETT inserted through vocal cords under direct vision, positive ETCO2 and breath sounds checked- equal and bilateral Secured at: 21 cm Tube secured with: Tape Dental Injury: Teeth and Oropharynx as per pre-operative assessment

## 2023-04-26 NOTE — Brief Op Note (Signed)
04/26/2023  3:48 PM  PATIENT:  Catherine Leonard  57 y.o. 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  POST-OPERATIVE DIAGNOSIS:  * No post-op diagnosis entered *  PROCEDURE:  Procedure(s): Extreme lateral interbody fusion (XLIF) L2-4 (N/A)  SURGEON:  Surgeon(s) and Role:    Venita Lick, MD - Primary  PHYSICIAN ASSISTANT:   ASSISTANTS: none   ANESTHESIA:   general  EBL:  50 mL   BLOOD ADMINISTERED:none  DRAINS: none   LOCAL MEDICATIONS USED:  MARCAINE     SPECIMEN:  none  DISPOSITION OF SPECIMEN:  N/A  COUNTS:  YES  TOURNIQUET:  * No tourniquets in log *  DICTATION: .Dragon Dictation  PLAN OF CARE: Admit to inpatient   PATIENT DISPOSITION:  PACU - hemodynamically stable.

## 2023-04-26 NOTE — Anesthesia Preprocedure Evaluation (Signed)
Anesthesia Evaluation  Patient identified by MRN, date of birth, ID band Patient awake    Reviewed: Allergy & Precautions, H&P , NPO status , Patient's Chart, lab work & pertinent test results  Airway Mallampati: II  TM Distance: >3 FB Neck ROM: Full    Dental no notable dental hx.    Pulmonary neg pulmonary ROS, former smoker   Pulmonary exam normal breath sounds clear to auscultation       Cardiovascular negative cardio ROS Normal cardiovascular exam Rhythm:Regular Rate:Normal     Neuro/Psych   Anxiety     negative neurological ROS  negative psych ROS   GI/Hepatic negative GI ROS, Neg liver ROS,,,  Endo/Other  negative endocrine ROS    Renal/GU negative Renal ROS  negative genitourinary   Musculoskeletal  (+) Arthritis , Osteoarthritis,    Abdominal   Peds negative pediatric ROS (+)  Hematology negative hematology ROS (+)   Anesthesia Other Findings   Reproductive/Obstetrics negative OB ROS                             Anesthesia Physical Anesthesia Plan  ASA: 3  Anesthesia Plan: General   Post-op Pain Management: Dilaudid IV   Induction: Intravenous  PONV Risk Score and Plan: 3 and Ondansetron, Dexamethasone, Midazolam and Treatment may vary due to age or medical condition  Airway Management Planned: Oral ETT  Additional Equipment:   Intra-op Plan:   Post-operative Plan: Extubation in OR  Informed Consent: I have reviewed the patients History and Physical, chart, labs and discussed the procedure including the risks, benefits and alternatives for the proposed anesthesia with the patient or authorized representative who has indicated his/her understanding and acceptance.     Dental advisory given  Plan Discussed with: CRNA  Anesthesia Plan Comments:        Anesthesia Quick Evaluation

## 2023-04-26 NOTE — Transfer of Care (Signed)
Immediate Anesthesia Transfer of Care Note  Patient: Catherine Leonard  Procedure(s) Performed: Extreme lateral interbody fusion (XLIF) L2-4  Patient Location: PACU  Anesthesia Type:General  Level of Consciousness: awake, alert , patient cooperative, and responds to stimulation  Airway & Oxygen Therapy: Patient Spontanous Breathing and Patient connected to face mask oxygen  Post-op Assessment: Report given to RN, Post -op Vital signs reviewed and stable, and Patient moving all extremities X 4  Post vital signs: Reviewed and stable  Last Vitals:  Vitals Value Taken Time  BP 156/128 04/26/23 1545  Temp    Pulse 75 04/26/23 1541  Resp 17 04/26/23 1546  SpO2 95 % 04/26/23 1541  Vitals shown include unvalidated device data.  Last Pain:  Vitals:   04/26/23 1155  PainSc: 4          Complications: No notable events documented.

## 2023-04-26 NOTE — Anesthesia Procedure Notes (Signed)
Anesthesia Regional Block: TAP block   Pre-Anesthetic Checklist: , timeout performed,  Correct Patient, Correct Site, Correct Laterality,  Correct Procedure, Correct Position, site marked,  Risks and benefits discussed,  Surgical consent,  Pre-op evaluation,  At surgeon's request and post-op pain management  Laterality: Left  Prep: chloraprep       Needles:  Injection technique: Single-shot  Needle Type: Stimiplex     Needle Length: 9cm  Needle Gauge: 21     Additional Needles:   Procedures:,,,, ultrasound used (permanent image in chart),,    Narrative:  Start time: 04/26/2023 11:50 AM End time: 04/26/2023 11:55 AM Injection made incrementally with aspirations every 5 mL.  Performed by: Personally  Anesthesiologist: Lowella Curb, MD

## 2023-04-27 ENCOUNTER — Inpatient Hospital Stay (HOSPITAL_COMMUNITY): Payer: BC Managed Care – PPO | Admitting: Anesthesiology

## 2023-04-27 ENCOUNTER — Inpatient Hospital Stay (HOSPITAL_COMMUNITY): Payer: BC Managed Care – PPO

## 2023-04-27 ENCOUNTER — Inpatient Hospital Stay (HOSPITAL_COMMUNITY)
Admission: RE | Admit: 2023-04-27 | Payer: BC Managed Care – PPO | Source: Home / Self Care | Admitting: Orthopedic Surgery

## 2023-04-27 ENCOUNTER — Encounter (HOSPITAL_COMMUNITY): Payer: Self-pay | Admitting: Orthopedic Surgery

## 2023-04-27 ENCOUNTER — Encounter (HOSPITAL_COMMUNITY)
Admission: RE | Disposition: A | Discharge status: Home / Self Care | Payer: Self-pay | Source: Home / Self Care | Attending: Orthopedic Surgery

## 2023-04-27 HISTORY — PX: SPINAL FUSION: SHX223

## 2023-04-27 SURGERY — FUSION, SPINE, 2 OR MORE LEVELS, POSTERIOR APPROACH
Anesthesia: General | Site: Spine Lumbar

## 2023-04-27 MED ORDER — ROCURONIUM BROMIDE 10 MG/ML (PF) SYRINGE
PREFILLED_SYRINGE | INTRAVENOUS | Status: AC
  Filled 2023-04-27: qty 10

## 2023-04-27 MED ORDER — CHLORHEXIDINE GLUCONATE 0.12 % MT SOLN: 15.0000 mL | Freq: Once | OROMUCOSAL | Status: AC

## 2023-04-27 MED ORDER — DEXMEDETOMIDINE HCL IN NACL 80 MCG/20ML IV SOLN
INTRAVENOUS | Status: DC | PRN
  Administered 2023-04-27: 4 ug via INTRAVENOUS
  Administered 2023-04-27: 8 ug via INTRAVENOUS

## 2023-04-27 MED ORDER — BUPIVACAINE-EPINEPHRINE 0.25% -1:200000 IJ SOLN
INTRAMUSCULAR | Status: DC | PRN
  Administered 2023-04-27: 10 mL

## 2023-04-27 MED ORDER — DEXAMETHASONE SODIUM PHOSPHATE 10 MG/ML IJ SOLN
INTRAMUSCULAR | Status: AC
  Filled 2023-04-27: qty 1

## 2023-04-27 MED ORDER — ONDANSETRON HCL 4 MG/2ML IJ SOLN
INTRAMUSCULAR | Status: AC
  Filled 2023-04-27: qty 2

## 2023-04-27 MED ORDER — CEFAZOLIN SODIUM-DEXTROSE 2-3 GM-%(50ML) IV SOLR
INTRAVENOUS | Status: DC | PRN
  Administered 2023-04-27: 2 g via INTRAVENOUS

## 2023-04-27 MED ORDER — OXYCODONE-ACETAMINOPHEN 10-325 MG PO TABS: 1.0000 | ORAL_TABLET | Freq: Four times a day (QID) | ORAL | 0 refills | Status: AC | PRN

## 2023-04-27 MED ORDER — SUGAMMADEX SODIUM 200 MG/2ML IV SOLN
INTRAVENOUS | Status: DC | PRN
  Administered 2023-04-27: 100 mg via INTRAVENOUS

## 2023-04-27 MED ORDER — ONDANSETRON HCL 4 MG PO TABS: 4.0000 mg | ORAL_TABLET | Freq: Three times a day (TID) | ORAL | 0 refills | Status: AC | PRN

## 2023-04-27 MED ORDER — METHOCARBAMOL 500 MG PO TABS: 500.0000 mg | ORAL_TABLET | Freq: Three times a day (TID) | ORAL | 0 refills | Status: AC | PRN

## 2023-04-27 MED ORDER — BUPIVACAINE-EPINEPHRINE (PF) 0.25% -1:200000 IJ SOLN
INTRAMUSCULAR | Status: AC
  Filled 2023-04-27: qty 30

## 2023-04-27 MED ORDER — CHLORHEXIDINE GLUCONATE 0.12 % MT SOLN
OROMUCOSAL | Status: AC
  Administered 2023-04-27: 15 mL via OROMUCOSAL
  Filled 2023-04-27: qty 15

## 2023-04-27 MED ORDER — PROPOFOL 10 MG/ML IV BOLUS
INTRAVENOUS | Status: DC | PRN
  Administered 2023-04-27: 150 mg via INTRAVENOUS

## 2023-04-27 MED ORDER — DEXAMETHASONE SODIUM PHOSPHATE 4 MG/ML IJ SOLN
INTRAMUSCULAR | Status: DC | PRN
  Administered 2023-04-27: 6 mg via INTRAVENOUS

## 2023-04-27 MED ORDER — ROCURONIUM BROMIDE 10 MG/ML (PF) SYRINGE
PREFILLED_SYRINGE | INTRAVENOUS | Status: DC | PRN
  Administered 2023-04-27: 60 mg via INTRAVENOUS

## 2023-04-27 MED ORDER — PHENYLEPHRINE HCL-NACL 20-0.9 MG/250ML-% IV SOLN
INTRAVENOUS | Status: DC | PRN
  Administered 2023-04-27: 50 ug/min via INTRAVENOUS

## 2023-04-27 MED ORDER — LACTATED RINGERS IV SOLN: INTRAVENOUS | Status: DC

## 2023-04-27 MED ORDER — EPHEDRINE SULFATE-NACL 50-0.9 MG/10ML-% IV SOSY
PREFILLED_SYRINGE | INTRAVENOUS | Status: DC | PRN
  Administered 2023-04-27: 5 mg via INTRAVENOUS

## 2023-04-27 MED ORDER — HYDROMORPHONE HCL 1 MG/ML IJ SOLN
INTRAMUSCULAR | Status: AC
  Filled 2023-04-27: qty 1

## 2023-04-27 MED ORDER — ORAL CARE MOUTH RINSE: 15.0000 mL | Freq: Once | OROMUCOSAL | Status: AC

## 2023-04-27 MED ORDER — LIDOCAINE 2% (20 MG/ML) 5 ML SYRINGE
INTRAMUSCULAR | Status: DC | PRN
  Administered 2023-04-27: 60 mg via INTRAVENOUS

## 2023-04-27 MED ORDER — LIDOCAINE 2% (20 MG/ML) 5 ML SYRINGE
INTRAMUSCULAR | Status: AC
  Filled 2023-04-27: qty 5

## 2023-04-27 MED ORDER — THROMBIN 20000 UNITS EX SOLR
CUTANEOUS | Status: AC
  Filled 2023-04-27: qty 20000

## 2023-04-27 MED ORDER — FENTANYL CITRATE (PF) 100 MCG/2ML IJ SOLN
INTRAMUSCULAR | Status: DC | PRN
  Administered 2023-04-27 (×2): 50 ug via INTRAVENOUS
  Administered 2023-04-27: 100 ug via INTRAVENOUS

## 2023-04-27 MED ORDER — VANCOMYCIN HCL 500 MG IV SOLR
INTRAVENOUS | Status: DC | PRN
  Administered 2023-04-27: 500 mg via TOPICAL

## 2023-04-27 MED ORDER — PHENYLEPHRINE 80 MCG/ML (10ML) SYRINGE FOR IV PUSH (FOR BLOOD PRESSURE SUPPORT)
PREFILLED_SYRINGE | INTRAVENOUS | Status: AC
  Filled 2023-04-27: qty 10

## 2023-04-27 MED ORDER — MIDAZOLAM HCL 2 MG/2ML IJ SOLN
INTRAMUSCULAR | Status: AC
  Filled 2023-04-27: qty 2

## 2023-04-27 MED ORDER — MIDAZOLAM HCL 5 MG/5ML IJ SOLN
INTRAMUSCULAR | Status: DC | PRN
  Administered 2023-04-27: 2 mg via INTRAVENOUS

## 2023-04-27 MED ORDER — HYDROMORPHONE HCL 1 MG/ML IJ SOLN
0.2500 mg | INTRAMUSCULAR | Status: DC | PRN
  Administered 2023-04-27 (×2): 0.5 mg via INTRAVENOUS

## 2023-04-27 MED ORDER — ONDANSETRON HCL 4 MG/2ML IJ SOLN: 4.0000 mg | Freq: Once | INTRAMUSCULAR | Status: DC | PRN

## 2023-04-27 MED ORDER — HEMOSTATIC AGENTS (NO CHARGE) OPTIME
TOPICAL | Status: DC | PRN
  Administered 2023-04-27: 1 via TOPICAL

## 2023-04-27 MED ORDER — AMISULPRIDE (ANTIEMETIC) 5 MG/2ML IV SOLN: 10.0000 mg | Freq: Once | INTRAVENOUS | Status: DC | PRN

## 2023-04-27 MED ORDER — VANCOMYCIN HCL 500 MG IV SOLR
INTRAVENOUS | Status: AC
  Filled 2023-04-27: qty 10

## 2023-04-27 MED ORDER — PHENYLEPHRINE 80 MCG/ML (10ML) SYRINGE FOR IV PUSH (FOR BLOOD PRESSURE SUPPORT)
PREFILLED_SYRINGE | INTRAVENOUS | Status: DC | PRN
  Administered 2023-04-27: 160 ug via INTRAVENOUS
  Administered 2023-04-27: 80 ug via INTRAVENOUS
  Administered 2023-04-27: 160 ug via INTRAVENOUS

## 2023-04-27 MED ORDER — ALBUMIN HUMAN 5 % IV SOLN: INTRAVENOUS | Status: DC | PRN

## 2023-04-27 MED ORDER — FENTANYL CITRATE (PF) 250 MCG/5ML IJ SOLN
INTRAMUSCULAR | Status: AC
  Filled 2023-04-27: qty 5

## 2023-04-27 MED ORDER — SENNOSIDES-DOCUSATE SODIUM 8.6-50 MG PO TABS
1.0000 | ORAL_TABLET | Freq: Two times a day (BID) | ORAL | Status: DC
  Administered 2023-04-27: 1 via ORAL
  Filled 2023-04-27 (×2): qty 1

## 2023-04-27 MED ORDER — 0.9 % SODIUM CHLORIDE (POUR BTL) OPTIME
TOPICAL | Status: DC | PRN
  Administered 2023-04-27: 1000 mL

## 2023-04-27 SURGICAL SUPPLY — 78 items
AGENT HMST KT MTR STRL THRMB (HEMOSTASIS) ×1
BAG COUNTER SPONGE SURGICOUNT (BAG) ×1 IMPLANT
BAG SPNG CNTER NS LX DISP (BAG) ×1
BLADE CLIPPER SURG (BLADE) IMPLANT
BUR EGG ELITE 4.0 (BURR) ×1 IMPLANT
BUR MATCHSTICK NEURO 3.0 LAGG (BURR) IMPLANT
CAP RELINE MOD TULIP RMM (Cap) IMPLANT
CLIP NEUROVISION LG (NEUROSURGERY SUPPLIES) IMPLANT
CLSR STERI-STRIP ANTIMIC 1/2X4 (GAUZE/BANDAGES/DRESSINGS) ×1 IMPLANT
CNTNR URN SCR LID CUP LEK RST (MISCELLANEOUS) IMPLANT
CONT SPEC 4OZ STRL OR WHT (MISCELLANEOUS) ×1
CORD BIPOLAR FORCEPS 12FT (ELECTRODE) ×1 IMPLANT
COVER MAYO STAND STRL (DRAPES) ×2 IMPLANT
COVER SURGICAL LIGHT HANDLE (MISCELLANEOUS) ×1 IMPLANT
DRAIN CHANNEL 15F RND FF W/TCR (WOUND CARE) IMPLANT
DRAPE C-ARM 42X72 X-RAY (DRAPES) ×1 IMPLANT
DRAPE C-ARMOR (DRAPES) IMPLANT
DRAPE INCISE IOBAN 66X45 STRL (DRAPES) IMPLANT
DRAPE POUCH INSTRU U-SHP 10X18 (DRAPES) ×1 IMPLANT
DRAPE SURG 17X23 STRL (DRAPES) ×1 IMPLANT
DRAPE U-SHAPE 47X51 STRL (DRAPES) ×1 IMPLANT
DRSG AQUACEL AG ADV 3.5X10 (GAUZE/BANDAGES/DRESSINGS) ×1 IMPLANT
DRSG OPSITE POSTOP 4X6 (GAUZE/BANDAGES/DRESSINGS) ×1 IMPLANT
DRSG OPSITE POSTOP 4X8 (GAUZE/BANDAGES/DRESSINGS) IMPLANT
DURAPREP 26ML APPLICATOR (WOUND CARE) ×1 IMPLANT
ELECT BLADE 4.0 EZ CLEAN MEGAD (MISCELLANEOUS) ×1
ELECT PENCIL ROCKER SW 15FT (MISCELLANEOUS) ×1 IMPLANT
ELECT REM PT RETURN 9FT ADLT (ELECTROSURGICAL) ×1
ELECTRODE BLDE 4.0 EZ CLN MEGD (MISCELLANEOUS) IMPLANT
ELECTRODE REM PT RTRN 9FT ADLT (ELECTROSURGICAL) ×1 IMPLANT
GLOVE BIO SURGEON STRL SZ 6.5 (GLOVE) ×1 IMPLANT
GLOVE BIO SURGEON STRL SZ7 (GLOVE) IMPLANT
GLOVE BIOGEL PI IND STRL 6.5 (GLOVE) ×1 IMPLANT
GLOVE BIOGEL PI IND STRL 7.5 (GLOVE) IMPLANT
GLOVE BIOGEL PI IND STRL 8.5 (GLOVE) ×1 IMPLANT
GLOVE SS BIOGEL STRL SZ 8.5 (GLOVE) ×1 IMPLANT
GOWN STRL REUS W/ TWL XL LVL3 (GOWN DISPOSABLE) ×2 IMPLANT
GOWN STRL REUS W/TWL 2XL LVL3 (GOWN DISPOSABLE) ×2 IMPLANT
GOWN STRL REUS W/TWL XL LVL3 (GOWN DISPOSABLE) ×3
KIT BASIN OR (CUSTOM PROCEDURE TRAY) ×1 IMPLANT
KIT TURNOVER KIT B (KITS) ×1 IMPLANT
MIX DBX 20CC MTF (Putty) IMPLANT
MODULE EMG NDL SSEP NVM5 (NEUROSURGERY SUPPLIES) IMPLANT
MODULE EMG NEEDLE SSEP NVM5 (NEUROSURGERY SUPPLIES) ×1 IMPLANT
MODULE NVM5 NEXT GEN EMG (NEUROSURGERY SUPPLIES) IMPLANT
NDL 22X1.5 STRL (OR ONLY) (MISCELLANEOUS) ×1 IMPLANT
NDL SPNL 18GX3.5 QUINCKE PK (NEEDLE) ×1 IMPLANT
NEEDLE 22X1.5 STRL (OR ONLY) (MISCELLANEOUS) ×1 IMPLANT
NEEDLE SPNL 18GX3.5 QUINCKE PK (NEEDLE) ×1 IMPLANT
NS IRRIG 1000ML POUR BTL (IV SOLUTION) ×1 IMPLANT
PACK LAMINECTOMY ORTHO (CUSTOM PROCEDURE TRAY) ×1 IMPLANT
PACK UNIVERSAL I (CUSTOM PROCEDURE TRAY) ×1 IMPLANT
PAD ARMBOARD 7.5X6 YLW CONV (MISCELLANEOUS) ×2 IMPLANT
PATTIES SURGICAL .5 X.5 (GAUZE/BANDAGES/DRESSINGS) IMPLANT
PATTIES SURGICAL .5 X1 (DISPOSABLE) ×1 IMPLANT
PROBE BALL TIP NVM5 SNG USE (NEUROSURGERY SUPPLIES) IMPLANT
ROD LORD RELINE O 5X80 (Rod) IMPLANT
SCREW LOCK RSS 4.5/5.0MM (Screw) IMPLANT
SCREW SHANK RELINE 6.5X40MM (Screw) IMPLANT
SCREW SHANK RELINE MOD 5.5X35 (Screw) IMPLANT
SPONGE SURGIFOAM ABS GEL 100 (HEMOSTASIS) ×1 IMPLANT
SPONGE T-LAP 4X18 ~~LOC~~+RFID (SPONGE) ×2 IMPLANT
SURGIFLO W/THROMBIN 8M KIT (HEMOSTASIS) IMPLANT
SUT BONE WAX W31G (SUTURE) ×1 IMPLANT
SUT MNCRL AB 3-0 PS2 27 (SUTURE) ×1 IMPLANT
SUT MNCRL+ AB 3-0 CT1 36 (SUTURE) IMPLANT
SUT STRATAFIX 1PDS 45CM VIOLET (SUTURE) IMPLANT
SUT VIC AB 1 CTX 36 (SUTURE)
SUT VIC AB 1 CTX36XBRD ANBCTR (SUTURE) ×2 IMPLANT
SUT VIC AB 2-0 CT1 18 (SUTURE) ×1 IMPLANT
SYR 5ML LUER SLIP (SYRINGE) IMPLANT
SYR BULB IRRIG 60ML STRL (SYRINGE) ×1 IMPLANT
SYR CONTROL 10ML LL (SYRINGE) ×1 IMPLANT
TOWEL GREEN STERILE (TOWEL DISPOSABLE) ×1 IMPLANT
TOWEL GREEN STERILE FF (TOWEL DISPOSABLE) ×1 IMPLANT
TRAY FOLEY MTR SLVR 16FR STAT (SET/KITS/TRAYS/PACK) ×1 IMPLANT
WATER STERILE IRR 1000ML POUR (IV SOLUTION) ×1 IMPLANT
YANKAUER SUCT BULB TIP NO VENT (SUCTIONS) ×1 IMPLANT

## 2023-04-27 NOTE — Progress Notes (Signed)
    Subjective: Procedure(s) (LRB): Posterior spinal fusion and insturmentation (PSFI) L2-4 POSSIBLE DECOMPRESSION (N/A) Day of Surgery  Patient reports pain as 2 on 0-10 scale.  Reports decreased leg pain reports incisional back pain   N/A void - foley inplace Negative bowel movement Positive flatus Negative chest pain or shortness of breath  Objective: Vital signs in last 24 hours: Temp:  [97.7 F (36.5 C)-98.9 F (37.2 C)] 98.7 F (37.1 C) (06/18 1018) Pulse Rate:  [63-96] 86 (06/18 1018) Resp:  [11-21] 17 (06/18 1018) BP: (106-175)/(71-105) 138/71 (06/18 1018) SpO2:  [91 %-100 %] 98 % (06/18 1018)  Intake/Output from previous day: 06/17 0701 - 06/18 0700 In: 2190 [P.O.:240; I.V.:1700; IV Piggyback:250] Out: 1000 [Urine:950; Blood:50]  Labs: Recent Labs    04/25/23 0853  WBC 8.1  RBC 3.92  HCT 39.0  PLT 420*   Recent Labs    04/25/23 0853  NA 133*  K 3.5  CL 97*  CO2 21*  BUN 6  CREATININE 0.75  GLUCOSE 115*  CALCIUM 8.9   No results for input(s): "LABPT", "INR" in the last 72 hours.  Physical Exam: Neurologically intact ABD soft Intact pulses distally Dorsiflexion/Plantar flexion intact Incision: dressing C/D/I Compartment soft Body mass index is 24.03 kg/m.   Assessment/Plan: Patient stable  Patient doing exceptionally well status post XLIF L2-4.  She notes that when she is doing today she felt and looked straighter. She denies any chest pain or rapid heartbeat/tachycardia. She had a brief episode of feeling lightheaded the first time she got up but this is resolved. She denies any radicular leg pain. At this point given the success of the reduction and the fact that her neuropathic pain is improved I have recommended moving forward with just a posterior supplemental fusion.  I have explained this to the patient and her husband and they are both in agreement with the plan.  Risks and benefits were reviewed.  Consent was obtained.  Venita Lick, MD [facility] [phone]

## 2023-04-27 NOTE — Op Note (Signed)
OPERATIVE REPORT  DATE OF SURGERY: 04/27/2023  PATIENT NAME:  Catherine Leonard MRN: [id number] DOB: Feb 01, 1966  PCP: Lezlie Lye, FNP  PRE-OPERATIVE DIAGNOSIS: Status post XLIF L2-4 for degenerative scoliosis with spinal stenosis with  POST-OPERATIVE DIAGNOSIS: Same  PROCEDURE:   Posterior pedicle screw fixation L2-4 with posterior lateral arthrodesis  SURGEON:  Venita Lick, MD  PHYSICIAN ASSISTANT: None  ANESTHESIA:   General  EBL: 50 ml   Complications: None  Implants: NuVasive cortical pedicle screw fixation.  L2: 5.5 x 35 mm length.  L3 and L4: 6.5 x 40 mm length.  80 mm rod with locking caps.  Monitoring: No adverse free running EMG activity throughout the case.  All 6 pedicle screws were directly stimulated.  There was no adverse activity greater than 20 mA on the right side.  19 mA on the left L2 screw and 20 mA L3 and L4 on the left.  Graft: Local autograft bone and DBX mix  BRIEF HISTORY: Catherine Leonard is a 57 y.o. female who degenerative lumbar scoliosis with spinal stenosis.  Attempts at conservative management had failed to alleviate her pain or improve her quality of life.  As result we elected to move forward with a staged to level lumbar fusion L2-4.  Patient underwent the lateral interbody fusion yesterday and noted improvement in her pain, alignment, and quality of life.  Given the decreased radicular pain we elected to move forward with just the supplemental fixation.  Risks, benefits, and alternatives were discussed with the patient and her husband and consent was obtained.  PROCEDURE DETAILS: Patient was brought into the operating room and was properly positioned on the operating room table.  After induction with general anesthesia the patient was endotracheally intubated.  A timeout was taken to confirm all important data: including patient, procedure, and the level. Teds, SCD's were applied.  The neuromonitoring representative placed all appropriate pads  for intraoperative EMG monitoring.  Patient was turned prone onto the [facility] spine frame.  All bony prominences well-padded and the back was prepped and draped in a standard fashion.  Using fluoroscopy I marked out the L2, 3, and 4 pedicles and marked out my midline incision.  I infiltrated this incision with quarter percent Marcaine with epinephrine.  Midline incision was made and sharp dissection was carried out down to the deep fascia the deep fascia was sharply incised and I stripped the paraspinal muscles to expose the spinous process of L3, and L4 and the inferior portion of L2.  I then stripped out over the facet complexes to expose and entirety of the L2-3 and L3-4 facet capsule.  The inferior portion of the facet capsule of L1/2 was also exposed.  I then confirmed with fluoroscopy that I was at the appropriate levels.  Once I confirm the levels I then remove the facet capsule at L2-3 and L3-4 with the Bovie.  Once the facet was completely exposed I then began placing my screws.  Starting at the inferior medial portion of the pedicle is seen on the AP view I scored the cortex with the bur.  I then advanced the pedicle awl towards the superior lateral border of the pedicle.  I monitored the trajectory with fluoroscopy.  Once I confirmed the trajectory in both planes and then removed the awl dated the hole with a ball-tipped feeler.  Once I confirmed that the hole was intact I then tapped with the appropriate sized tap and then repeat palpated the hole.  The screw was  then obtained and placed without incident.  I repeated this exact same procedure at the L3 and L4 level and on the contralateral side.  Once all 6 pedicle screws were properly positioned I then stimulated them all and there was no free running EMG activity to suggest breach and neural compromise.  At this point the wound was copiously irrigated with normal saline.  I then remove the spinous processes of L3 and L4 to use as autograft.  This  was mixed with the DBX mix.  Using a high-speed bur I completely decorticated the L2-3 and L3-4 facet complexes and the L2 transverse process.  The posterior lateral gutter as well as the facets were packed with bone graft.  I then applied the polyaxial heads and then inserted the rod.  The locking caps were engaged and then tightened/torqued to appropriate level according manufacture standards.  At this point the posterior lateral arthrodesis in posterior fusion/instrumentation was complete.  I then obtained hemostasis using proper electrocautery and Floseal.  Additional vancomycin powder was then placed in the wound.  I then closed the deep fascia with a running #1 strata fix suture.  I then placed additional antibiotic in the superficial area and then closed with interrupted 2-0 Vicryl suture.  3-0 Monocryl was then placed to close the skin.  Steri-Strips and a dry dressing were applied.  A final AP x-ray was taken which demonstrated satisfactory overall alignment of the spine.  The scoliosis was improved when compared to her preop x-rays.  Patient was ultimately extubated transfer the PACU without incident.  The end of the case all needle sponge counts were correct.      Venita Lick, MD 04/27/2023 1:35 PM

## 2023-04-27 NOTE — Anesthesia Procedure Notes (Signed)
Procedure Name: Intubation Date/Time: 04/27/2023 11:18 AM  Performed by: Caren Macadam, CRNAPre-anesthesia Checklist: Patient identified, Emergency Drugs available, Suction available and Patient being monitored Patient Re-evaluated:Patient Re-evaluated prior to induction Oxygen Delivery Method: Circle system utilized Preoxygenation: Pre-oxygenation with 100% oxygen Induction Type: IV induction Ventilation: Mask ventilation without difficulty Laryngoscope Size: Miller and 2 Grade View: Grade I Tube type: Oral Tube size: 7.0 mm Number of attempts: 1 Airway Equipment and Method: Stylet Placement Confirmation: ETT inserted through vocal cords under direct vision, positive ETCO2 and breath sounds checked- equal and bilateral Secured at: 21 cm Tube secured with: Tape Dental Injury: Teeth and Oropharynx as per pre-operative assessment

## 2023-04-27 NOTE — Discharge Instructions (Signed)

## 2023-04-27 NOTE — Anesthesia Preprocedure Evaluation (Signed)
Anesthesia Evaluation  Patient identified by MRN, date of birth, ID band Patient awake    Reviewed: Allergy & Precautions, NPO status , Patient's Chart, lab work & pertinent test results, reviewed documented beta blocker date and time   Airway Mallampati: III  TM Distance: >3 FB     Dental no notable dental hx. (+) Teeth Intact, Dental Advisory Given   Pulmonary pneumonia, resolved, former smoker   Pulmonary exam normal breath sounds clear to auscultation       Cardiovascular hypertension, Pt. on medications and Pt. on home beta blockers Normal cardiovascular exam Rhythm:Regular Rate:Normal     Neuro/Psych  PSYCHIATRIC DISORDERS Anxiety     Neurogenic claudication    GI/Hepatic Neg liver ROS,,,  Endo/Other  Hyperlipidemia  Renal/GU negative Renal ROS  negative genitourinary   Musculoskeletal  (+) Arthritis , Osteoarthritis,  Lumbar spinal stenosis/scoliosis with neurogenic claudication Chronic back pain Ambulates with cane, uses wheelchair   Abdominal   Peds  Hematology negative hematology ROS (+)   Anesthesia Other Findings   Reproductive/Obstetrics                              Anesthesia Physical Anesthesia Plan  ASA: 3  Anesthesia Plan: General   Post-op Pain Management: Dilaudid IV, Precedex, Ofirmev IV (intra-op)* and Gabapentin PO (pre-op)*   Induction: Intravenous  PONV Risk Score and Plan: 4 or greater and Treatment may vary due to age or medical condition, Ondansetron, Midazolam and Dexamethasone  Airway Management Planned: Oral ETT  Additional Equipment: None  Intra-op Plan:   Post-operative Plan: Extubation in OR  Informed Consent: I have reviewed the patients History and Physical, chart, labs and discussed the procedure including the risks, benefits and alternatives for the proposed anesthesia with the patient or authorized representative who has indicated his/her  understanding and acceptance.     Dental advisory given  Plan Discussed with: CRNA and Anesthesiologist  Anesthesia Plan Comments:          Anesthesia Quick Evaluation

## 2023-04-27 NOTE — Brief Op Note (Signed)
04/27/2023  1:45 PM  PATIENT:  Catherine Leonard  57 y.o. 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  POST-OPERATIVE DIAGNOSIS:  lumbar spinal stenosis/scoliosis with neurogenic claudication  PROCEDURE:  Procedure(s): Posterior fusion and instrumentation lumbar two to four (N/A)  SURGEON:  Surgeon(s) and Role:    Venita Lick, MD - Primary  PHYSICIAN ASSISTANT:   ASSISTANTS: none   ANESTHESIA:   general  EBL:  50 mL   BLOOD ADMINISTERED:none  DRAINS: none   LOCAL MEDICATIONS USED:  MARCAINE     SPECIMEN:  No Specimen  DISPOSITION OF SPECIMEN:  N/A  COUNTS:  YES  TOURNIQUET:  * No tourniquets in log *  DICTATION: .Dragon Dictation  PLAN OF CARE: Admit to inpatient   PATIENT DISPOSITION:  PACU - hemodynamically stable.

## 2023-04-27 NOTE — Transfer of Care (Signed)
Immediate Anesthesia Transfer of Care Note  Patient: Catherine Leonard  Procedure(s) Performed: Posterior fusion and instrumentation lumbar two to four (Spine Lumbar)  Patient Location: PACU  Anesthesia Type:General  Level of Consciousness: awake, alert , and oriented  Airway & Oxygen Therapy: Patient Spontanous Breathing and Patient connected to face mask oxygen  Post-op Assessment: Report given to RN and Post -op Vital signs reviewed and stable  Post vital signs: Reviewed and stable  Last Vitals:  Vitals Value Taken Time  BP 118/79 04/27/23 1330  Temp    Pulse 86 04/27/23 1333  Resp 13 04/27/23 1334  SpO2 99 % 04/27/23 1333  Vitals shown include unvalidated device data.  Last Pain:  Vitals:   04/27/23 0959  TempSrc: Oral  PainSc:       Patients Stated Pain Goal: 3 (04/27/23 0745)  Complications: No notable events documented.

## 2023-04-27 NOTE — Anesthesia Postprocedure Evaluation (Signed)
Anesthesia Post Note  Patient: Catherine Leonard  Procedure(s) Performed: Posterior fusion and instrumentation lumbar two to four (Spine Lumbar)     Patient location during evaluation: PACU Anesthesia Type: General Level of consciousness: awake and alert and oriented Pain management: pain level controlled Vital Signs Assessment: post-procedure vital signs reviewed and stable Respiratory status: spontaneous breathing, nonlabored ventilation and respiratory function stable Cardiovascular status: blood pressure returned to baseline and stable Postop Assessment: no apparent nausea or vomiting Anesthetic complications: no   No notable events documented.  Last Vitals:  Vitals:   04/27/23 1400 04/27/23 1415  BP: 133/78 117/81  Pulse: 82 81  Resp: 11 11  Temp:    SpO2: 95% 98%    Last Pain:  Vitals:   04/27/23 1400  TempSrc:   PainSc: Asleep      LLE Sensation: Full sensation (04/27/23 1415)   RLE Sensation: Full sensation (04/27/23 1415)      Petar Mucci A.

## 2023-04-28 ENCOUNTER — Encounter (HOSPITAL_COMMUNITY): Payer: Self-pay | Admitting: Orthopedic Surgery

## 2023-04-28 DIAGNOSIS — I951 Orthostatic hypotension: Secondary | ICD-10-CM

## 2023-04-28 DIAGNOSIS — I471 Supraventricular tachycardia, unspecified: Secondary | ICD-10-CM

## 2023-04-28 MED ORDER — ENSURE ENLIVE PO LIQD
237.0000 mL | Freq: Two times a day (BID) | ORAL | Status: DC
  Administered 2023-04-29: 237 mL via ORAL
  Filled 2023-04-28 (×3): qty 237

## 2023-04-28 MED ORDER — SODIUM CHLORIDE 0.9 % IV BOLUS
500.0000 mL | Freq: Once | INTRAVENOUS | Status: AC
  Administered 2023-04-28: 500 mL via INTRAVENOUS

## 2023-04-28 MED ORDER — LIDOCAINE VISCOUS HCL 2 % MT SOLN
15.0000 mL | OROMUCOSAL | Status: DC | PRN
  Filled 2023-04-28 (×2): qty 15

## 2023-04-28 MED ORDER — SODIUM CHLORIDE 0.9 % IV SOLN: INTRAVENOUS | Status: DC

## 2023-04-28 NOTE — Consult Note (Signed)
CARDIOLOGY CONSULT NOTE       Patient ID: Catherine Leonard MRN: [id number] DOB/AGE: 59/12/1965 59 y.o.  Admit date: 04/26/2023 Referring Physician: Shon Baton Primary Physician: Lezlie Lye, FNP Primary Cardiologist: Eden Emms Reason for Consultation: Hypotension  Principal Problem:   S/P lumbar fusion   HPI:  57 y.o. now day one post 2 stage lumbar surgery with Dr Shon Baton Surgery went very well with no complications Hct is stable 35.1 TSH normal not azotemic. Patient seen by me 04/25/23 post [facility] in field by EMS for SVT rate > 200 bpm. She r/o MI. Baseline ECG normal with no accessory pathway. TTE was normal with no valve dx and EF 60-65% She has done well post op but has had postural symptoms when getting up with BP systolic around 90 mmHg No signs of infections Prior lumbar back pain much improved.   ROS All other systems reviewed and negative except as noted above  Past Medical History:  Diagnosis Date   Ambulates with cane    and wheelchair r/t back pain   Anxiety    Arthritis    Chronic hyponatremia    Hip pain    bilateral, in [place]   Pneumonia    x 1 at age 57   Pure hypercholesterolemia     History reviewed. No pertinent family history.  Social History   Socioeconomic History   Marital status: Legally Separated    Spouse name: Not on file   Number of children: Not on file   Years of education: Not on file   Highest education level: Not on file  Occupational History   Not on file  Tobacco Use   Smoking status: Former    Packs/day: 0.50    Years: 40.00    Additional pack years: 0.00    Total pack years: 20.00    Types: Cigarettes    Quit date: 03/17/2023    Years since quitting: 0.1   Smokeless tobacco: Current  Vaping Use   Vaping Use: Some days   Substances: CBD  Substance and Sexual Activity   Alcohol use: Yes    Comment: wine - 1 bottle per night   Drug use: Yes    Types: Marijuana    Comment: daily- informed pt to not smoke 24 hrs prior to surgery    Sexual activity: Yes    Birth control/protection: Post-menopausal  Other Topics Concern   Not on file  Social History Narrative   Not on file   Social Determinants of Health   Financial Resource Strain: Not on file  Food Insecurity: Not on file  Transportation Needs: Not on file  Physical Activity: Not on file  Stress: Not on file  Social Connections: Not on file  Intimate Partner Violence: Not on file    Past Surgical History:  Procedure Laterality Date   COLONOSCOPY  03/27/2016   in [place]   SPINAL FUSION N/A 04/27/2023   Procedure: Posterior fusion and instrumentation lumbar two to four;  Surgeon: Venita Lick, MD;  Location: [facility] OR;  Service: Orthopedics;  Laterality: N/A;   TONSILLECTOMY     as a child      Current Facility-Administered Medications:    0.9 %  sodium chloride infusion, 250 mL, Intravenous, Continuous, Shon Baton, Dahari, MD   0.9 %  sodium chloride infusion, , Intravenous, Continuous, Eisa Conaway, Noralyn Pick, MD   acetaminophen (TYLENOL) tablet 650 mg, 650 mg, Oral, Q4H PRN, 650 mg at 04/28/23 0643 **OR** acetaminophen (TYLENOL) suppository 650 mg, 650 mg, Rectal, Q4H  PRN, Venita Lick, MD   ALPRAZolam Prudy Feeler) tablet 0.5 mg, 0.5 mg, Oral, BID PRN, Venita Lick, MD, 0.5 mg at 04/27/23 2048   feeding supplement (ENSURE ENLIVE / ENSURE PLUS) liquid 237 mL, 237 mL, Oral, BID BM, Venita Lick, MD   gabapentin (NEURONTIN) capsule 600 mg, 600 mg, Oral, TID, Venita Lick, MD, 600 mg at 04/28/23 1016   lactated ringers infusion, , Intravenous, Continuous, Venita Lick, MD   menthol-cetylpyridinium (CEPACOL) lozenge 3 mg, 1 lozenge, Oral, PRN **OR** phenol (CHLORASEPTIC) mouth spray 1 spray, 1 spray, Mouth/Throat, PRN, Venita Lick, MD   methocarbamol (ROBAXIN) tablet 500 mg, 500 mg, Oral, Q6H PRN, 500 mg at 04/28/23 1017 **OR** methocarbamol (ROBAXIN) 500 mg in dextrose 5 % 50 mL IVPB, 500 mg, Intravenous, Q6H PRN, Venita Lick, MD   ondansetron (ZOFRAN) tablet 4 mg, 4  mg, Oral, Q6H PRN **OR** ondansetron (ZOFRAN) injection 4 mg, 4 mg, Intravenous, Q6H PRN, Venita Lick, MD, 4 mg at 04/27/23 1139   oxyCODONE (Oxy IR/ROXICODONE) immediate release tablet 10 mg, 10 mg, Oral, Q3H PRN, Venita Lick, MD, 10 mg at 04/28/23 1017   oxyCODONE (Oxy IR/ROXICODONE) immediate release tablet 5 mg, 5 mg, Oral, Q3H PRN, Venita Lick, MD   polyethylene glycol (MIRALAX / GLYCOLAX) packet 17 g, 17 g, Oral, Daily PRN, Venita Lick, MD   senna-docusate (Senokot-S) tablet 1 tablet, 1 tablet, Oral, BID, Venita Lick, MD, 1 tablet at 04/27/23 2047   sodium chloride flush (NS) 0.9 % injection 3 mL, 3 mL, Intravenous, Q12H, Venita Lick, MD, 3 mL at 04/27/23 2047   sodium chloride flush (NS) 0.9 % injection 3 mL, 3 mL, Intravenous, PRN, Venita Lick, MD  feeding supplement  237 mL Oral BID BM   gabapentin  600 mg Oral TID   senna-docusate  1 tablet Oral BID   sodium chloride flush  3 mL Intravenous Q12H    sodium chloride     sodium chloride     lactated ringers     methocarbamol (ROBAXIN) IV      Physical Exam: Blood pressure 90/66, pulse (!) 102, temperature 98.9 F (37.2 C), temperature source Oral, resp. rate 16, height 5' (1.524 m), weight 55.8 kg, SpO2 98 %.    Affect appropriate Healthy:  appears stated age HEENT: normal Neck supple with no adenopathy JVP normal no bruits no thyromegaly Lungs clear with no wheezing and good diaphragmatic motion Heart:  S1/S2 no murmur, no rub, gallop or click PMI normal Post lumbar surgery with clean dressings No edema Neuro non-focal Skin warm and dry No muscular weakness   Labs:   Lab Results  Component Value Date   WBC 8.1 04/25/2023   HGB 13.5 04/25/2023   HCT 39.0 04/25/2023   MCV 99.5 04/25/2023   PLT 420 (H) 04/25/2023    Recent Labs  Lab 04/25/23 0853  NA 133*  K 3.5  CL 97*  CO2 21*  BUN 6  CREATININE 0.75  CALCIUM 8.9  PROT 6.8  BILITOT 0.8  ALKPHOS 61  ALT 22  AST 26  GLUCOSE 115*    No results found for: "CKTOTAL", "CKMB", "CKMBINDEX", "TROPONINI" No results found for: "CHOL" No results found for: "HDL" No results found for: "LDLCALC" No results found for: "TRIG" No results found for: "CHOLHDL" No results found for: "LDLDIRECT"    Radiology: DG Lumbar Spine 2-3 Views  Result Date: 04/27/2023 CLINICAL DATA:  Elective surgery. EXAM: LUMBAR SPINE - 2-3 VIEW COMPARISON:  None Available. FINDINGS: Four fluoroscopic spot views  of the lumbar spine obtained in the operating room in frontal and lateral projections. Posterior rod with intrapedicular screw fusion and interbody spacers from L2 through L4. Fluoroscopy time 125.8 seconds. Dose: 58.18 mGy IMPRESSION: Intraoperative fluoroscopy during lumbar fusion. Electronically Signed   By: Narda Rutherford M.D.   On: 04/27/2023 16:10   DG Lumbar Spine 1 View  Result Date: 04/27/2023 CLINICAL DATA:  Elective surgery. EXAM: LUMBAR SPINE - 1 VIEW COMPARISON:  None available. FINDINGS: Single intraop view of the lumbar spine obtained in the operating room. Spinal fusion hardware from L2 through L4 with interbody spacers in place. IMPRESSION: Spinal fusion hardware from L2 through L4. Electronically Signed   By: Narda Rutherford M.D.   On: 04/27/2023 16:09   DG C-Arm 1-60 Min-No Report  Result Date: 04/27/2023 Fluoroscopy was utilized by the requesting physician.  No radiographic interpretation.   DG C-Arm 1-60 Min-No Report  Result Date: 04/27/2023 Fluoroscopy was utilized by the requesting physician.  No radiographic interpretation.   DG OR LOCAL ABDOMEN  Result Date: 04/26/2023 CLINICAL DATA:  Elective surgery.  L2-4 XLIF EXAM: OR LOCAL ABDOMEN COMPARISON:  None Available. FINDINGS: Portable AP supine view of the lumbar spine/abdomen obtained in the operating room. There are interbody spacers at L2-L3 and L3-L4. Presumed external artifact related to operative instruments. IMPRESSION: Single intraoperative view of the lumbar  spine/abdomen obtained in the operating room. Electronically Signed   By: Narda Rutherford M.D.   On: 04/26/2023 17:42   DG Lumbar Spine 2-3 Views  Result Date: 04/26/2023 CLINICAL DATA:  Elective surgery.  L2-L4 XLIF. EXAM: LUMBAR SPINE - 2-3 VIEW COMPARISON:  None Available. FINDINGS: Two fluoroscopic spot views of the lumbar spine obtained in the operating room. Interbody spacers at multiple levels, levels difficult to delineate due to coned view. Fluoroscopy time 2 minutes 54 seconds. Dose 72.57 mGy. IMPRESSION: Intraoperative fluoroscopy during lumbar surgery. Electronically Signed   By: Narda Rutherford M.D.   On: 04/26/2023 17:41   DG C-Arm 1-60 Min-No Report  Result Date: 04/26/2023 Fluoroscopy was utilized by the requesting physician.  No radiographic interpretation.   DG C-Arm 1-60 Min-No Report  Result Date: 04/26/2023 Fluoroscopy was utilized by the requesting physician.  No radiographic interpretation.   DG C-Arm 1-60 Min-No Report  Result Date: 04/26/2023 Fluoroscopy was utilized by the requesting physician.  No radiographic interpretation.   ECHOCARDIOGRAM COMPLETE  Result Date: 04/25/2023    ECHOCARDIOGRAM REPORT   Patient Name:   NAKIYA UTTERBACK Date of Exam: 04/25/2023 Medical Rec #:  [id number]      Height:       60.0 in Accession #:    [id number]     Weight:       123.0 lb Date of Birth:  05-29-1966       BSA:          1.518 m Patient Age:    57 years       BP:           158/85 mmHg Patient Gender: F              HR:           97 bpm. Exam Location:  Inpatient Procedure: 2D Echo Indications:    SVT  History:        Patient has no prior history of Echocardiogram examinations.                 Arrythmias:SVT.  Sonographer:    Sheralyn Boatman  RDCS Referring Phys: [id number] Jonavon Trieu C Tacoya Altizer IMPRESSIONS  1. Left ventricular ejection fraction, by estimation, is 60 to 65%. The left ventricle has normal function. The left ventricle has no regional wall motion abnormalities. Left ventricular diastolic  parameters were normal.  2. Right ventricular systolic function is normal. The right ventricular size is normal.  3. The mitral valve is normal in structure. No evidence of mitral valve regurgitation. No evidence of mitral stenosis.  4. The aortic valve is normal in structure. Aortic valve regurgitation is not visualized. No aortic stenosis is present.  5. The inferior vena cava is normal in size with greater than 50% respiratory variability, suggesting right atrial pressure of 3 mmHg. FINDINGS  Left Ventricle: Left ventricular ejection fraction, by estimation, is 60 to 65%. The left ventricle has normal function. The left ventricle has no regional wall motion abnormalities. The left ventricular internal cavity size was normal in size. There is  no left ventricular hypertrophy. Left ventricular diastolic parameters were normal. Right Ventricle: The right ventricular size is normal. No increase in right ventricular wall thickness. Right ventricular systolic function is normal. Left Atrium: Left atrial size was normal in size. Right Atrium: Right atrial size was normal in size. Pericardium: There is no evidence of pericardial effusion. Mitral Valve: The mitral valve is normal in structure. No evidence of mitral valve regurgitation. No evidence of mitral valve stenosis. Tricuspid Valve: The tricuspid valve is normal in structure. Tricuspid valve regurgitation is not demonstrated. No evidence of tricuspid stenosis. Aortic Valve: The aortic valve is normal in structure. Aortic valve regurgitation is not visualized. No aortic stenosis is present. Pulmonic Valve: The pulmonic valve was normal in structure. Pulmonic valve regurgitation is not visualized. No evidence of pulmonic stenosis. Aorta: The aortic root is normal in size and structure. Venous: The inferior vena cava is normal in size with greater than 50% respiratory variability, suggesting right atrial pressure of 3 mmHg. IAS/Shunts: No atrial level shunt detected  by color flow Doppler.  LEFT VENTRICLE PLAX 2D LVIDd:         3.80 cm     Diastology LVIDs:         2.40 cm     LV e' medial:    7.29 cm/s LV PW:         1.10 cm     LV E/e' medial:  9.1 LV IVS:        1.10 cm     LV e' lateral:   5.60 cm/s LVOT diam:     2.10 cm     LV E/e' lateral: 11.8 LV SV:         56 LV SV Index:   37 LVOT Area:     3.46 cm  LV Volumes (MOD) LV vol d, MOD A2C: 55.2 ml LV vol d, MOD A4C: 39.0 ml LV vol s, MOD A2C: 18.8 ml LV vol s, MOD A4C: 13.7 ml LV SV MOD A2C:     36.4 ml LV SV MOD A4C:     39.0 ml LV SV MOD BP:      30.7 ml RIGHT VENTRICLE             IVC RV S prime:     14.00 cm/s  IVC diam: 1.50 cm TAPSE (M-mode): 1.6 cm LEFT ATRIUM           Index        RIGHT ATRIUM          Index LA diam:  1.90 cm 1.25 cm/m   RA Area:     8.67 cm LA Vol (A2C): 16.5 ml 10.87 ml/m  RA Volume:   15.30 ml 10.08 ml/m LA Vol (A4C): 11.4 ml 7.51 ml/m  AORTIC VALVE LVOT Vmax:   106.00 cm/s LVOT Vmean:  68.000 cm/s LVOT VTI:    0.162 m  AORTA Ao Root diam: 2.70 cm Ao Asc diam:  3.50 cm MITRAL VALVE MV Area (PHT): 5.27 cm    SHUNTS MV Decel Time: 144 msec    Systemic VTI:  0.16 m MV E velocity: 66.30 cm/s  Systemic Diam: 2.10 cm MV A velocity: 86.20 cm/s MV E/A ratio:  0.77 Charlton Haws MD Electronically signed by Charlton Haws MD Signature Date/Time: 04/25/2023/10:41:51 AM    Final     EKG: 04/25/23 SR normal    ASSESSMENT AND PLAN:   Hypotension: post anesthesia and 2 stage lumbar surgery Postural symptoms can be fairly common after back surgery No signs of sepsis, infection, anemia, bleeding or arrhythmia TSH is normal. Lytes not Azotemic Recent TTE shows normal EF hydrate NS 120 cc / hr till am. Stop lopressor. If still low and postural in am can consider abdominal binder and short course of midodrine 10 mg tid   SVT:  post [facility] 04/25/23 no accessory pathway. Stopping prophylactic beta blocker now that surgery is over and BP low Rx in hospital with adenosine if recurs    Signed: Charlton Haws 04/28/2023, 1:24 PM

## 2023-04-28 NOTE — Progress Notes (Signed)
Pt is orthostatic, BP 76/53 when she ambulated. She is asymptomatic. Orders given. Rema Fendt, RN

## 2023-04-28 NOTE — Consult Note (Signed)
ENT CONSULT:  Reason for Consult: Oral pain  Referring Physician:  Dr. Shon Baton  HPI: Catherine Leonard is an 57 y.o. female who is admitted following lumbar surgery. Patient states that she has been dealing with oral pain on the left for the last 2 months. She was seen by her dentist who stated symptoms were secondary to a stone, and recommended hydration and use of sialogogues. She was treated with an oral antibiotic without significant improvement. Patient reports difficulty with oral intake but no history of facial or neck swelling. She also reports halitosis.    Past Medical History:  Diagnosis Date   Ambulates with cane    and wheelchair r/t back pain   Anxiety    Arthritis    Chronic hyponatremia    Hip pain    bilateral, in [place]   Pneumonia    x 1 at age 58   Pure hypercholesterolemia     Past Surgical History:  Procedure Laterality Date   COLONOSCOPY  03/27/2016   in [place]   SPINAL FUSION N/A 04/27/2023   Procedure: Posterior fusion and instrumentation lumbar two to four;  Surgeon: Venita Lick, MD;  Location: [facility] OR;  Service: Orthopedics;  Laterality: N/A;   TONSILLECTOMY     as a child    History reviewed. No pertinent family history.  Social History:  reports that she quit smoking about 6 weeks ago. Her smoking use included cigarettes. She has a 20.00 pack-year smoking history. She uses smokeless tobacco. She reports current alcohol use. She reports current drug use. Drug: Marijuana.  Allergies: No Known Allergies  Medications: I have reviewed the patient's current medications.  No results found for this or any previous visit (from the past 48 hour(s)).  DG Lumbar Spine 2-3 Views  Result Date: 04/27/2023 CLINICAL DATA:  Elective surgery. EXAM: LUMBAR SPINE - 2-3 VIEW COMPARISON:  None Available. FINDINGS: Four fluoroscopic spot views of the lumbar spine obtained in the operating room in frontal and lateral projections. Posterior rod with intrapedicular screw fusion  and interbody spacers from L2 through L4. Fluoroscopy time 125.8 seconds. Dose: 58.18 mGy IMPRESSION: Intraoperative fluoroscopy during lumbar fusion. Electronically Signed   By: Narda Rutherford M.D.   On: 04/27/2023 16:10   DG Lumbar Spine 1 View  Result Date: 04/27/2023 CLINICAL DATA:  Elective surgery. EXAM: LUMBAR SPINE - 1 VIEW COMPARISON:  None available. FINDINGS: Single intraop view of the lumbar spine obtained in the operating room. Spinal fusion hardware from L2 through L4 with interbody spacers in place. IMPRESSION: Spinal fusion hardware from L2 through L4. Electronically Signed   By: Narda Rutherford M.D.   On: 04/27/2023 16:09   DG C-Arm 1-60 Min-No Report  Result Date: 04/27/2023 Fluoroscopy was utilized by the requesting physician.  No radiographic interpretation.   DG C-Arm 1-60 Min-No Report  Result Date: 04/27/2023 Fluoroscopy was utilized by the requesting physician.  No radiographic interpretation.    ROS:ROS  Blood pressure (!) 97/59, pulse (!) 101, temperature (!) 100.6 F (38.1 C), temperature source Oral, resp. rate 16, height 5' (1.524 m), weight 55.8 kg, SpO2 98 %.  PHYSICAL EXAM: CONSTITUTIONAL: well developed, nourished, no distress and alert and oriented x 3 PULMONARY/CHEST WALL: effort normal and no stridor, no stertor, no dysphonia HENT: Head : normocephalic and atraumatic Ears: Right ear:   canal normal, external ear normal and hearing normal Left ear:   canal normal, external ear normal and hearing normal Nose: nose normal and no purulence Mouth/Throat:  Mouth:  Floor of mouth soft with no palpable stone. Torus mandibulare noted bilaterally, with crusting and yellow plaque-like tissue on left torus.  Mucous membranes: normal EYES: conjunctiva normal, EOM normal and PERRL NECK: supple, trachea normal and no thyromegaly or cervical LAD  Studies Reviewed:None  Assessment/Plan: Catherine Leonard is a 57 y/o F s/p lumbar surgery with 2 month history of oral  pain. Patient was counseled that there is no evidence of sialolithiasis on exam, and pain correlates to inflamed torus mandibulare on left. Recommended diligent oral hygiene with use of Peridex rinses, and application of topical lidocaine as needed for pain. Patient scheduled with Otolaryngology in early July, recommend keeping appointment as scheduled.   Thank you for allowing me to participate in the care of this patient.   Laren Boom, DO Otolaryngology [facility]    04/28/2023, 4:39 PM

## 2023-04-28 NOTE — Evaluation (Signed)
Physical Therapy Evaluation Patient Details Name: Catherine Leonard MRN: [id number] DOB: 1966/06/01 Today's Date: 04/28/2023  History of Present Illness  Patient is a 57 y.o. woman s/p XLIF L2-4 (04/27/2023). PMH significant for Chronic hyponatremia and pneumonia.  Clinical Impression  Upon evaluation, patient received ambulating in hallway at supervision assist with boyfriend. For 3 weeks prior to admission, patient required a manual w/c for household ambulation due to pain in lower back. She is supported by her boyfriend who works from home and can provide support PRN. Currently, patient is able to perform bed mobility, transfers and ambulation without physical assistance (walked 100 feet before return to bed). Session limited due to pt reporting symptoms of nausea and lightheadness after ambulation. BP monitored (68/53 recovered to 100/61 in supine). Pt endorsed being able to walk 300 ft asymptomatic with nursing staff. Pt and boyfriend stated pt didn't eat enough prior to therapy. Nursing staff notified about blood pressure and patient left in supine. Recommend next session to progress gait and stair training. Anticipating patient to return home with family support as needed.      Recommendations for follow up therapy are one component of a multi-disciplinary discharge planning process, led by the attending physician.  Recommendations may be updated based on patient status, additional functional criteria and insurance authorization.  Follow Up Recommendations       Assistance Recommended at Discharge Intermittent Supervision/Assistance  Patient can return home with the following  A little help with walking and/or transfers;A little help with bathing/dressing/bathroom;Assist for transportation;Help with stairs or ramp for entrance    Equipment Recommendations Rolling walker (2 wheels)  Recommendations for Other Services       Functional Status Assessment Patient has had a recent decline in  their functional status and demonstrates the ability to make significant improvements in function in a reasonable and predictable amount of time.     Precautions / Restrictions Precautions Precautions: Back Precaution Booklet Issued: No (Provide in next session) Precaution Comments: Patient recalled 3/3 precautions per physician. Educated on precautions when performing functional mobility. Required Braces or Orthoses: Spinal Brace Spinal Brace: Lumbar corset;Applied in sitting position Restrictions Weight Bearing Restrictions: No      Mobility  Bed Mobility Overal bed mobility: Modified Independent             General bed mobility comments: Received pateint walking in hallway with boyfriend at start of session. Able to perform without physical assistance; increase time noted for EOB <> sidelying    Transfers Overall transfer level: Modified independent Equipment used: Rolling walker (2 wheels)               General transfer comment: Transferred from Stand to sit with controlled descent and no physical assistance.    Ambulation/Gait Ambulation/Gait assistance: Supervision Gait Distance (Feet): 100 Feet Assistive device: Rolling walker (2 wheels) Gait Pattern/deviations: Step-through pattern, Decreased stride length, Decreased step length - right, Decreased step length - left Gait velocity: Decreased Gait velocity interpretation: <1.8 ft/sec, indicate of risk for recurrent falls   General Gait Details: Patient presented with step through pattern and able to perform without physical assistance at start of session.  Stairs Stairs:  (Deferred to next session.)          Wheelchair Mobility    Modified Rankin (Stroke Patients Only)       Balance Overall balance assessment: Mild deficits observed, not formally tested  Pertinent Vitals/Pain Pain Assessment Pain Assessment: 0-10 Pain Score: 8   Pain Location: Incision and L hip Pain Descriptors / Indicators: Aching, Sharp Pain Intervention(s): Limited activity within patient's tolerance, Monitored during session    Home Living Family/patient expects to be discharged to:: Private residence Living Arrangements: Spouse/significant other Available Help at Discharge: Available 24 hours/day;Other (Comment) (Boyfriend) Type of Home: House Home Access: Stairs to enter Entrance Stairs-Rails: None Entrance Stairs-Number of Steps: 3 Alternate Level Stairs-Number of Steps: 5 Home Layout: Bed/bath upstairs;Two level Home Equipment: Wheelchair - manual      Prior Function Prior Level of Function : Independent/Modified Independent;Driving;Working/employed             Mobility Comments: 3 Weeks prior to surgery patient required manual w/c for household ambulation.       Hand Dominance   Dominant Hand: Right    Extremity/Trunk Assessment   Upper Extremity Assessment Upper Extremity Assessment: Overall WFL for tasks assessed    Lower Extremity Assessment Lower Extremity Assessment: Generalized weakness    Cervical / Trunk Assessment Cervical / Trunk Assessment: Back Surgery  Communication   Communication: No difficulties  Cognition Arousal/Alertness: Awake/alert Behavior During Therapy: WFL for tasks assessed/performed Overall Cognitive Status: Within Functional Limits for tasks assessed                                 General Comments: A&Ox4; pleasant throughout session        General Comments General comments (skin integrity, edema, etc.): Received patient standing and amb in hallway with boyfriend; walked back to room with report of lightheadedness and needing to use restroom. Upon sitting on hospital bed patient reported nausea and lightheadedness. Pt returned to supine and BP recorded at 68/53; recovered to 100/61 at end of session.    Exercises     Assessment/Plan    PT Assessment Patient  needs continued PT services  PT Problem List Decreased activity tolerance;Decreased strength;Pain;Decreased mobility       PT Treatment Interventions DME instruction;Gait training;Stair training;Functional mobility training;Therapeutic activities;Therapeutic exercise;Patient/family education    PT Goals (Current goals can be found in the Care Plan section)  Acute Rehab PT Goals Patient Stated Goal: Patient wants to return to home and hiking without pain PT Goal Formulation: With patient/family Time For Goal Achievement: 05/05/23 Potential to Achieve Goals: Good    Frequency 7X/week     Co-evaluation               AM-PAC PT "6 Clicks" Mobility  Outcome Measure Help needed turning from your back to your side while in a flat bed without using bedrails?: None Help needed moving from lying on your back to sitting on the side of a flat bed without using bedrails?: None Help needed moving to and from a bed to a chair (including a wheelchair)?: None Help needed standing up from a chair using your arms (e.g., wheelchair or bedside chair)?: A Little Help needed to walk in hospital room?: A Little Help needed climbing 3-5 steps with a railing? : A Lot 6 Click Score: 20    End of Session Equipment Utilized During Treatment: Back brace Activity Tolerance: Treatment limited secondary to medical complications (Comment) (Reported lightheadedness/nausea; significant low blood pressure.) Patient left: in bed;with call bell/phone within reach;with family/visitor present Nurse Communication: Mobility status;Other (comment) (Notified about Blood pressure) PT Visit Diagnosis: Muscle weakness (generalized) (M62.81);Difficulty in walking, not elsewhere classified (R26.2);Pain Pain - part  of body:  (Back)    Time: 1610-9604 PT Time Calculation (min) (ACUTE ONLY): 26 min   Charges:   PT Evaluation $PT Eval Low Complexity: 1 Low PT Treatments $Gait Training: 8-22 mins        Christene Lye, SPT Acute Rehabilitation Services [phone] Secure chat preferred    Christene Lye 04/28/2023, 1:16 PM

## 2023-04-28 NOTE — Evaluation (Addendum)
Occupational Therapy Evaluation Patient Details Name: Catherine Leonard MRN: [id number] DOB: 10-11-1966 Today's Date: 04/28/2023   History of Present Illness Patient is a 57 y.o. woman s/p XLIF L2-4 (04/27/2023). PMH significant for Chronic hyponatremia and pneumonia.   Clinical Impression   Pt admitted for above dx, PTA patient lived with boyfriend who can provide 24/7 assist and reports being ind in ADLs and ambulating using w/c. Pt currently presenting with back pain s/p surgery which impacts balance and orthostatic but was asymptomatic this session. Pt demonstrating good carryover of back precautions and body mechanics with functional transfers and mobility, recalls 3/3 POB precautions. Pt completing hall ambulation with supervision and Min A for lower body dressing and bathing to which she is comfortable with her boyfriend helping. Pt would benefit from continue acute skilled OT services to address above deficits and maximize functional independence with bADLs.  NO follow up OT recommended at this time.      Recommendations for follow up therapy are one component of a multi-disciplinary discharge planning process, led by the attending physician.  Recommendations may be updated based on patient status, additional functional criteria and insurance authorization.   Assistance Recommended at Discharge Intermittent Supervision/Assistance  Patient can return home with the following A little help with bathing/dressing/bathroom;Assistance with cooking/housework    Functional Status Assessment  Patient has had a recent decline in their functional status and demonstrates the ability to make significant improvements in function in a reasonable and predictable amount of time.  Equipment Recommendations  None recommended by OT (RW)    Recommendations for Other Services       Precautions / Restrictions Precautions Precautions: Back Required Braces or Orthoses: Spinal Brace Spinal Brace: Lumbar  corset;Applied in sitting position Restrictions Weight Bearing Restrictions: No      Mobility Bed Mobility Overal bed mobility: Modified Independent             General bed mobility comments: Pt demonstrating good carryover of log roll technique for bed mobility    Transfers Overall transfer level: Needs assistance Equipment used: Rolling walker (2 wheels) Transfers: Sit to/from Stand Sit to Stand: Modified independent (Device/Increase time)                  Balance Overall balance assessment: Mild deficits observed, not formally tested                                         ADL either performed or assessed with clinical judgement   ADL Overall ADL's : Needs assistance/impaired Eating/Feeding: Independent;Sitting   Grooming: Standing;Supervision/safety   Upper Body Bathing: Independent;Sitting   Lower Body Bathing: Sitting/lateral leans;Minimal assistance   Upper Body Dressing : Sitting;Independent   Lower Body Dressing: Sitting/lateral leans;Minimal assistance Lower Body Dressing Details (indicate cue type and reason): pt can achieve figure four position, takes additional effort and time Toilet Transfer: Ambulation;Supervision/safety;Rolling walker (2 wheels)   Toileting- Clothing Manipulation and Hygiene: Sit to/from stand;Supervision/safety       Functional mobility during ADLs: Supervision/safety;Rolling walker (2 wheels)       Vision         Perception     Praxis      Pertinent Vitals/Pain Pain Assessment Pain Assessment: Faces Faces Pain Scale: Hurts little more Pain Location: back Pain Descriptors / Indicators: Aching, Sharp Pain Intervention(s): Repositioned, Monitored during session     Hand Dominance Right  Extremity/Trunk Assessment Upper Extremity Assessment Upper Extremity Assessment: Overall WFL for tasks assessed   Lower Extremity Assessment Lower Extremity Assessment: Generalized weakness    Cervical / Trunk Assessment Cervical / Trunk Assessment: Back Surgery   Communication Communication Communication: No difficulties   Cognition Arousal/Alertness: Awake/alert Behavior During Therapy: WFL for tasks assessed/performed Overall Cognitive Status: Within Functional Limits for tasks assessed                                       General Comments  Pt without any c/o lightheadedness during session, following hall ambulation BP noted to bed 73/48 taken while patient returned to sidelying bed position.    Exercises     Shoulder Instructions      Home Living Family/patient expects to be discharged to:: Private residence Living Arrangements: Spouse/significant other Available Help at Discharge: Available 24 hours/day;Other (Comment) (boyfriend) Type of Home: House Home Access: Stairs to enter [employer] of Steps: 3 Entrance Stairs-Rails: None Home Layout: Bed/bath upstairs;Two level Alternate Level Stairs-Number of Steps: 5 Alternate Level Stairs-Rails: Left Bathroom Shower/Tub: Producer, television/film/video: Standard     Home Equipment: Wheelchair - manual;Shower seat;Hand held shower head          Prior Functioning/Environment Prior Level of Function : Independent/Modified Independent;Driving;Working/employed             Mobility Comments: 3 Weeks prior to surgery patient required manual w/c for household ambulation. ADLs Comments: ind        OT Problem List: Pain;Impaired balance (sitting and/or standing)      OT Treatment/Interventions: Self-care/ADL training;Balance training;Therapeutic exercise;DME and/or AE instruction;Therapeutic activities;Patient/family education    OT Goals(Current goals can be found in the care plan section) Acute Rehab OT Goals Patient Stated Goal: To go home OT Goal Formulation: With patient Time For Goal Achievement: 05/12/23 Potential to Achieve Goals: Good ADL Goals Pt Will Perform  Lower Body Bathing: with modified independence Pt Will Perform Lower Body Dressing: with modified independence  OT Frequency: Min 2X/week    Co-evaluation              AM-PAC OT "6 Clicks" Daily Activity     Outcome Measure Help from another person eating meals?: None Help from another person taking care of personal grooming?: A Little Help from another person toileting, which includes using toliet, bedpan, or urinal?: A Little Help from another person bathing (including washing, rinsing, drying)?: A Little Help from another person to put on and taking off regular upper body clothing?: None Help from another person to put on and taking off regular lower body clothing?: A Little 6 Click Score: 20   End of Session Equipment Utilized During Treatment: Gait belt;Rolling walker (2 wheels) Nurse Communication: Mobility status  Activity Tolerance: Patient tolerated treatment well Patient left: in bed;with call bell/phone within reach  OT Visit Diagnosis: Unsteadiness on feet (R26.81);Pain                Time: 1610-9604 OT Time Calculation (min): 37 min Charges:  OT General Charges $OT Visit: 1 Visit OT Evaluation $OT Eval Moderate Complexity: 1 Mod OT Treatments $Therapeutic Activity: 8-22 mins  04/28/2023  AB, OTR/L  Acute Rehabilitation Services  Office: [phone]   Tristan Schroeder 04/28/2023, 6:03 PM

## 2023-04-28 NOTE — Progress Notes (Signed)
    Subjective: Procedure(s) (LRB): Posterior fusion and instrumentation lumbar two to four (N/A) 1 Day Post-Op  Patient reports pain as 3 on 0-10 scale.  Reports decreased leg pain reports incisional back pain   Positive void Positive bowel movement Positive flatus Negative chest pain or shortness of breath  Objective: Vital signs in last 24 hours: Temp:  [97.8 F (36.6 C)-99.7 F (37.6 C)] 98.9 F (37.2 C) (06/19 0813) Pulse Rate:  [78-99] 97 (06/19 0813) Resp:  [11-20] 16 (06/19 0813) BP: (105-133)/(63-94) 107/63 (06/19 0813) SpO2:  [93 %-100 %] 100 % (06/19 0813)  Intake/Output from previous day: 06/18 0701 - 06/19 0700 In: 1780 [P.O.:480; I.V.:1000; IV Piggyback:300] Out: 1250 [Urine:1200; Blood:50]  Labs: No results for input(s): "WBC", "RBC", "HCT", "PLT" in the last 72 hours. No results for input(s): "NA", "K", "CL", "CO2", "BUN", "CREATININE", "GLUCOSE", "CALCIUM" in the last 72 hours. No results for input(s): "LABPT", "INR" in the last 72 hours.  Physical Exam: Neurologically intact ABD soft Intact pulses distally Incision: dressing C/D/I and no drainage Compartment soft Body mass index is 24.03 kg/m.   Assessment/Plan: Patient stable  xrays n/a Continue mobilization with physical therapy Continue care  1.  Overall patient is doing exceptionally well. 2.  The patient does have episodic hypotension.  When she rises from a seated or supine position she does decrease her blood pressure significantly.  It does recover there is concern.  She feels lightheaded. 3.  She has had no further episodes of SVT.  Given the recent change in her medications and the episodic hypotension I will contact Dr. Eden Emms her cardiologist for further recommendations. 4.  Patient has a recent history of a salivary stone and has increased pain which is preventing her from eating.  Will contact ENT to see if she can be seen while she is hospitalized to have the stone evacuated. 5.   From the standpoint of her spine surgery I think she is doing well.  She is ambulating without significant difficulty and she remains neurologically intact.  Her preoperative radicular leg pain has significantly improved and her primary issue is incisional pain.  I do believe this will improve.  Plan on possible discharge home tomorrow.  Venita Lick, MD [facility] [phone]

## 2023-04-29 ENCOUNTER — Encounter (HOSPITAL_COMMUNITY): Payer: Self-pay | Admitting: Orthopedic Surgery

## 2023-04-29 DIAGNOSIS — Z981 Arthrodesis status: Secondary | ICD-10-CM

## 2023-04-29 MED ORDER — MIDODRINE HCL 5 MG PO TABS
10.0000 mg | ORAL_TABLET | Freq: Two times a day (BID) | ORAL | Status: DC
  Filled 2023-04-29: qty 2

## 2023-04-29 NOTE — Progress Notes (Signed)
Physical Therapy Treatment Patient Details Name: Catherine Leonard MRN: [id number] DOB: 01-02-1966 Today's Date: 04/29/2023   History of Present Illness Patient is a 57 y.o. woman s/p XLIF L2-4 (04/27/2023). PMH significant for Chronic hyponatremia and pneumonia.    PT Comments    Progressed patient towards goals per plan of care. Progressed ambulation distance without additional report of symptoms. Patient able to perform stair navigation without physical assistance. End of session included review on precautions, car transfer and exercise progression. Provided handout and patient able to recall and demonstration precautions throughout entire session. BP monitored at beginning and end of session; within normal limits. Recommend discharge to home with family support when needed.     Recommendations for follow up therapy are one component of a multi-disciplinary discharge planning process, led by the attending physician.  Recommendations may be updated based on patient status, additional functional criteria and insurance authorization.  Follow Up Recommendations       Assistance Recommended at Discharge Intermittent Supervision/Assistance  Patient can return home with the following A little help with walking and/or transfers;A little help with bathing/dressing/bathroom;Assist for transportation;Help with stairs or ramp for entrance   Equipment Recommendations  Rolling walker (2 wheels)    Recommendations for Other Services       Precautions / Restrictions Precautions Precautions: Back Precaution Booklet Issued: No (Provide in next session) Precaution Comments: Provided handout and reviewed precautions throughout funcional mobility Required Braces or Orthoses: Spinal Brace Spinal Brace: Lumbar corset;Applied in sitting position Restrictions Weight Bearing Restrictions: No     Mobility  Bed Mobility Overal bed mobility: Modified Independent             General bed mobility  comments: Continued to demonstrated good return for log roll, sidelying <> Sit maintaining precations    Transfers Overall transfer level: Needs assistance Equipment used: Rolling walker (2 wheels) Transfers: Sit to/from Stand Sit to Stand: Modified independent (Device/Increase time)           General transfer comment: Transferred from Stand to sit with controlled descent and no physical assistance.    Ambulation/Gait Ambulation/Gait assistance: Supervision Gait Distance (Feet): 250 Feet Assistive device: Rolling walker (2 wheels) Gait Pattern/deviations: Step-through pattern, Decreased stride length, Decreased step length - right, Decreased step length - left Gait velocity: Decreased Gait velocity interpretation: <1.8 ft/sec, indicate of risk for recurrent falls   General Gait Details: Progressed distance today; step through pattern and decreased velocity   Stairs Stairs: Yes Stairs assistance: Min guard Stair Management: One rail Left, No rails, Forwards Number of Stairs: 8 General stair comments: Trialed 4 steps with L rail support and 4 steps with HHA to simulate home environment. Patient able to perform with no physical assistance to ascend; presented with controlled descent   Wheelchair Mobility    Modified Rankin (Stroke Patients Only)       Balance Overall balance assessment: Mild deficits observed, not formally tested                                          Cognition Arousal/Alertness: Awake/alert Behavior During Therapy: WFL for tasks assessed/performed Overall Cognitive Status: Within Functional Limits for tasks assessed                                 General Comments: Pleasant throughout session  Exercises      General Comments General comments (skin integrity, edema, etc.): Pt reported no symptoms and BP has improved;117/78 at start of session, 111/78 at end of session.      Pertinent Vitals/Pain Pain  Assessment Pain Assessment: 0-10 Pain Score: 8  Pain Location: Incision and General Back Pain Descriptors / Indicators: Operative site guarding, Sore Pain Intervention(s): Limited activity within patient's tolerance, Monitored during session    Home Living                          Prior Function            PT Goals (current goals can now be found in the care plan section) Acute Rehab PT Goals Patient Stated Goal: Patient wants to return to home and hiking without pain PT Goal Formulation: With patient Time For Goal Achievement: 05/05/23 Potential to Achieve Goals: Good Progress towards PT goals: Progressing toward goals    Frequency    7X/week      PT Plan Current plan remains appropriate    Co-evaluation              AM-PAC PT "6 Clicks" Mobility   Outcome Measure  Help needed turning from your back to your side while in a flat bed without using bedrails?: None Help needed moving from lying on your back to sitting on the side of a flat bed without using bedrails?: None Help needed moving to and from a bed to a chair (including a wheelchair)?: None Help needed standing up from a chair using your arms (e.g., wheelchair or bedside chair)?: A Little Help needed to walk in hospital room?: A Little Help needed climbing 3-5 steps with a railing? : A Lot 6 Click Score: 20    End of Session Equipment Utilized During Treatment: Back brace;Gait belt Activity Tolerance: Patient tolerated treatment well (Reported lightheadedness/nausea; significant low blood pressure.) Patient left: in bed;with call bell/phone within reach Nurse Communication: Mobility status PT Visit Diagnosis: Muscle weakness (generalized) (M62.81);Difficulty in walking, not elsewhere classified (R26.2);Pain Pain - part of body:  (Back)     Time: 2956-2130 PT Time Calculation (min) (ACUTE ONLY): 30 min  Charges:                        Christene Lye, SPT Acute Rehabilitation  Services [phone] Secure chat preferred     Christene Lye 04/29/2023, 9:56 AM

## 2023-04-29 NOTE — Discharge Summary (Signed)
Patient ID: [name] MRN: [id number] DOB/AGE: April 13, 1966 57 y.o.  Admit date: 04/26/2023 Discharge date: 04/29/2023  Admission Diagnoses:  Principal Problem:   S/P lumbar fusion   Discharge Diagnoses:  Principal Problem:   S/P lumbar fusion  status post Procedure(s): Posterior fusion and instrumentation lumbar two to four  Past Medical History:  Diagnosis Date   Ambulates with cane    and wheelchair r/t back pain   Anxiety    Arthritis    Chronic hyponatremia    Hip pain    bilateral, in CE   Pneumonia    x 1 at age 22   Pure hypercholesterolemia     Surgeries: Procedure(s): Posterior fusion and instrumentation lumbar two to four on 04/27/2023   Consultants: Treatment Team:  Lbcardiology, Rounding, MD  Discharged Condition: Improved  Hospital Course: Catherine Leonard is an 57 y.o. female who was admitted 04/26/2023 for operative treatment of S/P lumbar fusion. Patient failed conservative treatments (please see the history and physical for the specifics) and had severe unremitting pain that affects sleep, daily activities and work/hobbies. After pre-op clearance, the patient was taken to the operating room on 04/27/2023 and underwent  Procedure(s): Posterior fusion and instrumentation lumbar two to four.    Patient was given perioperative antibiotics:  Anti-infectives (From admission, onward)    Start     Dose/Rate Route Frequency Ordered Stop   04/27/23 1256  vancomycin (VANCOCIN) powder  Status:  Discontinued          As needed 04/27/23 1257 04/27/23 1327   04/26/23 2000  ceFAZolin (ANCEF) IVPB 1 g/50 mL premix        1 g 100 mL/hr over 30 Minutes Intravenous Every 8 hours 04/26/23 1814 04/27/23 0519   04/26/23 0839  ceFAZolin (ANCEF) IVPB 2g/100 mL premix        2 g 200 mL/hr over 30 Minutes Intravenous 30 min pre-op 04/26/23 0839 04/26/23 1301        Patient was given sequential compression devices and early ambulation to prevent DVT.   Patient  benefited maximally from hospital stay and there were no complications. At the time of discharge, the patient was urinating/moving their bowels without difficulty, tolerating a regular diet, pain is controlled with oral pain medications and they have been cleared by PT/OT.   Recent vital signs: Patient Vitals for the past 24 hrs:  BP Temp Temp src Pulse Resp SpO2  04/29/23 0744 124/79 98.6 F (37 C) Oral 92 16 100 %  04/29/23 0348 105/69 99.3 F (37.4 C) Oral (!) 101 18 100 %  04/28/23 2243 109/66 98.5 F (36.9 C) Oral 92 18 97 %  04/28/23 1913 105/70 99.1 F (37.3 C) Oral 98 18 95 %  04/28/23 1819 96/60 -- -- 95 -- 96 %  04/28/23 1738 (!) 76/53 98.9 F (37.2 C) Oral 93 -- --  04/28/23 1636 (!) 97/59 (!) 100.6 F (38.1 C) Oral (!) 101 16 98 %  04/28/23 1307 90/66 -- -- (!) 102 16 98 %     Recent laboratory studies: No results for input(s): "WBC", "HGB", "HCT", "PLT", "NA", "K", "CL", "CO2", "BUN", "CREATININE", "GLUCOSE", "INR", "CALCIUM" in the last 72 hours.  Invalid input(s): "PT", "2"   Discharge Medications:   Allergies as of 04/29/2023   No Known Allergies      Medication List     STOP taking these medications    oxyCODONE-acetaminophen 5-325 MG tablet Commonly known as: PERCOCET/ROXICET Replaced by: oxyCODONE-acetaminophen 10-325 MG  tablet       TAKE these medications    ALPRAZolam 0.5 MG tablet Commonly known as: XANAX Take 0.5 mg by mouth 2 (two) times daily as needed for anxiety.   Clear Eyes for Dry Eyes 1-0.25 % Soln Generic drug: Carboxymethylcellul-Glycerin Place 1-2 drops into both eyes daily. After showering   gabapentin 300 MG capsule Commonly known as: NEURONTIN Take 600 mg by mouth 3 (three) times daily.   methocarbamol 500 MG tablet Commonly known as: ROBAXIN Take 1 tablet (500 mg total) by mouth every 8 (eight) hours as needed for up to 5 days for muscle spasms.   metoprolol succinate 50 MG 24 hr tablet Commonly known as:  TOPROL-XL Take 1 tablet (50 mg total) by mouth daily.   ondansetron 4 MG tablet Commonly known as: Zofran Take 1 tablet (4 mg total) by mouth every 8 (eight) hours as needed for nausea or vomiting.   oxyCODONE-acetaminophen 10-325 MG tablet Commonly known as: Percocet Take 1 tablet by mouth every 6 (six) hours as needed for up to 5 days for pain. Replaces: oxyCODONE-acetaminophen 5-325 MG tablet        Diagnostic Studies: DG Lumbar Spine 2-3 Views  Result Date: 04/27/2023 CLINICAL DATA:  Elective surgery. EXAM: LUMBAR SPINE - 2-3 VIEW COMPARISON:  None Available. FINDINGS: Four fluoroscopic spot views of the lumbar spine obtained in the operating room in frontal and lateral projections. Posterior rod with intrapedicular screw fusion and interbody spacers from L2 through L4. Fluoroscopy time 125.8 seconds. Dose: 58.18 mGy IMPRESSION: Intraoperative fluoroscopy during lumbar fusion. Electronically Signed   By: Narda Rutherford M.D.   On: 04/27/2023 16:10   DG Lumbar Spine 1 View  Result Date: 04/27/2023 CLINICAL DATA:  Elective surgery. EXAM: LUMBAR SPINE - 1 VIEW COMPARISON:  None available. FINDINGS: Single intraop view of the lumbar spine obtained in the operating room. Spinal fusion hardware from L2 through L4 with interbody spacers in place. IMPRESSION: Spinal fusion hardware from L2 through L4. Electronically Signed   By: Narda Rutherford M.D.   On: 04/27/2023 16:09   DG C-Arm 1-60 Min-No Report  Result Date: 04/27/2023 Fluoroscopy was utilized by the requesting physician.  No radiographic interpretation.   DG C-Arm 1-60 Min-No Report  Result Date: 04/27/2023 Fluoroscopy was utilized by the requesting physician.  No radiographic interpretation.   DG OR LOCAL ABDOMEN  Result Date: 04/26/2023 CLINICAL DATA:  Elective surgery.  L2-4 XLIF EXAM: OR LOCAL ABDOMEN COMPARISON:  None Available. FINDINGS: Portable AP supine view of the lumbar spine/abdomen obtained in the operating room.  There are interbody spacers at L2-L3 and L3-L4. Presumed external artifact related to operative instruments. IMPRESSION: Single intraoperative view of the lumbar spine/abdomen obtained in the operating room. Electronically Signed   By: Narda Rutherford M.D.   On: 04/26/2023 17:42   DG Lumbar Spine 2-3 Views  Result Date: 04/26/2023 CLINICAL DATA:  Elective surgery.  L2-L4 XLIF. EXAM: LUMBAR SPINE - 2-3 VIEW COMPARISON:  None Available. FINDINGS: Two fluoroscopic spot views of the lumbar spine obtained in the operating room. Interbody spacers at multiple levels, levels difficult to delineate due to coned view. Fluoroscopy time 2 minutes 54 seconds. Dose 72.57 mGy. IMPRESSION: Intraoperative fluoroscopy during lumbar surgery. Electronically Signed   By: Narda Rutherford M.D.   On: 04/26/2023 17:41   DG C-Arm 1-60 Min-No Report  Result Date: 04/26/2023 Fluoroscopy was utilized by the requesting physician.  No radiographic interpretation.   DG C-Arm 1-60 Min-No Report  Result Date:  04/26/2023 Fluoroscopy was utilized by the requesting physician.  No radiographic interpretation.   DG C-Arm 1-60 Min-No Report  Result Date: 04/26/2023 Fluoroscopy was utilized by the requesting physician.  No radiographic interpretation.   ECHOCARDIOGRAM COMPLETE  Result Date: 04/25/2023    ECHOCARDIOGRAM REPORT   Patient Name:   Catherine Leonard Date of Exam: 04/25/2023 Medical Rec #:  [id number]      Height:       60.0 in Accession #:    [id number]     Weight:       123.0 lb Date of Birth:  1965-12-25       BSA:          1.518 m Patient Age:    57 years       BP:           158/85 mmHg Patient Gender: F              HR:           97 bpm. Exam Location:  Inpatient Procedure: 2D Echo Indications:    SVT  History:        Patient has no prior history of Echocardiogram examinations.                 Arrythmias:SVT.  Sonographer:    Sheralyn Boatman RDCS Referring Phys: [id number]ade IMPRESSIONS  1. Left ventricular ejection  fraction, by estimation, is 60 to 65%. The left ventricle has normal function. The left ventricle has no regional wall motion abnormalities. Left ventricular diastolic parameters were normal.  2. Right ventricular systolic function is normal. The right ventricular size is normal.  3. The mitral valve is normal in structure. No evidence of mitral valve regurgitation. No evidence of mitral stenosis.  4. The aortic valve is normal in structure. Aortic valve regurgitation is not visualized. No aortic stenosis is present.  5. The inferior vena cava is normal in size with greater than 50% respiratory variability, suggesting right atrial pressure of 3 mmHg. FINDINGS  Left Ventricle: Left ventricular ejection fraction, by estimation, is 60 to 65%. The left ventricle has normal function. The left ventricle has no regional wall motion abnormalities. The left ventricular internal cavity size was normal in size. There is  no left ventricular hypertrophy. Left ventricular diastolic parameters were normal. Right Ventricle: The right ventricular size is normal. No increase in right ventricular wall thickness. Right ventricular systolic function is normal. Left Atrium: Left atrial size was normal in size. Right Atrium: Right atrial size was normal in size. Pericardium: There is no evidence of pericardial effusion. Mitral Valve: The mitral valve is normal in structure. No evidence of mitral valve regurgitation. No evidence of mitral valve stenosis. Tricuspid Valve: The tricuspid valve is normal in structure. Tricuspid valve regurgitation is not demonstrated. No evidence of tricuspid stenosis. Aortic Valve: The aortic valve is normal in structure. Aortic valve regurgitation is not visualized. No aortic stenosis is present. Pulmonic Valve: The pulmonic valve was normal in structure. Pulmonic valve regurgitation is not visualized. No evidence of pulmonic stenosis. Aorta: The aortic root is normal in size and structure. Venous: The  inferior vena cava is normal in size with greater than 50% respiratory variability, suggesting right atrial pressure of 3 mmHg. IAS/Shunts: No atrial level shunt detected by color flow Doppler.  LEFT VENTRICLE PLAX 2D LVIDd:         3.80 cm     Diastology LVIDs:  2.40 cm     LV e' medial:    7.29 cm/s LV PW:         1.10 cm     LV E/e' medial:  9.1 LV IVS:        1.10 cm     LV e' lateral:   5.60 cm/s LVOT diam:     2.10 cm     LV E/e' lateral: 11.8 LV SV:         56 LV SV Index:   37 LVOT Area:     3.46 cm  LV Volumes (MOD) LV vol d, MOD A2C: 55.2 ml LV vol d, MOD A4C: 39.0 ml LV vol s, MOD A2C: 18.8 ml LV vol s, MOD A4C: 13.7 ml LV SV MOD A2C:     36.4 ml LV SV MOD A4C:     39.0 ml LV SV MOD BP:      30.7 ml RIGHT VENTRICLE             IVC RV S prime:     14.00 cm/s  IVC diam: 1.50 cm TAPSE (M-mode): 1.6 cm LEFT ATRIUM           Index        RIGHT ATRIUM          Index LA diam:      1.90 cm 1.25 cm/m   RA Area:     8.67 cm LA Vol (A2C): 16.5 ml 10.87 ml/m  RA Volume:   15.30 ml 10.08 ml/m LA Vol (A4C): 11.4 ml 7.51 ml/m  AORTIC VALVE LVOT Vmax:   106.00 cm/s LVOT Vmean:  68.000 cm/s LVOT VTI:    0.162 m  AORTA Ao Root diam: 2.70 cm Ao Asc diam:  3.50 cm MITRAL VALVE MV Area (PHT): 5.27 cm    SHUNTS MV Decel Time: 144 msec    Systemic VTI:  0.16 m MV E velocity: 66.30 cm/s  Systemic Diam: 2.10 cm MV A velocity: 86.20 cm/s MV E/A ratio:  0.77 Charlton Haws MD Electronically signed by Charlton Haws MD Signature Date/Time: 04/25/2023/10:41:51 AM    Final     Discharge Instructions     Incentive spirometry RT   Complete by: As directed         Follow-up Information     Venita Lick, MD. Schedule an appointment as soon as possible for a visit in 1 week(s).   Specialty: Orthopedic Surgery Why: If symptoms worsen, For suture removal, For wound re-check Contact information: [address] [place] [address] [phone]                 Discharge Plan:  discharge  to home.  Disposition: Catherine Leonard is a pleasant 57 year old woman who underwent an anterior posterior L2-4 fusion for degenerative scoliosis.  Postoperatively she has remained neurologically intact.  Her sagittal and coronal alignment both improved status post surgery.  She is voiding spontaneously and tolerating a regular diet.  She had a positive bowel movement.  Her abdomen is very soft and nontender.  She did have an episode of hypotension when she arises out of a supine position.  She was evaluated by Dr. Lloyd Huger who adjusted her medications and she has been asymptomatic since.  At time of discharge the patient was hemodynamically intact and stable.  She had no episodes of SVT or chest discomfort.  She was neurologically intact.  Tolerating regular diet.  Her pain was well-controlled with oral medications.  She will be  discharged home and follow-up with me in 2 weeks for wound check.    Signed: Alvy Beal for Dr. Venita Lick [facility] [phone] 04/29/2023, 12:46 PM

## 2023-04-29 NOTE — Progress Notes (Signed)
    Subjective:  Ambulating last night BP a bit low asymptomatic and systolic  > 100 mmHg this am  Objective:  Vitals:   04/28/23 1913 04/28/23 2243 04/29/23 0348 04/29/23 0744  BP: 105/70 109/66 105/69 124/79  Pulse: 98 92 (!) 101 92  Resp: 18 18 18 16   Temp: 99.1 F (37.3 C) 98.5 F (36.9 C) 99.3 F (37.4 C) 98.6 F (37 C)  TempSrc: Oral Oral Oral Oral  SpO2: 95% 97% 100% 100%  Weight:      Height:        Intake/Output from previous day:  Intake/Output Summary (Last 24 hours) at 04/29/2023 0849 Last data filed at 04/29/2023 0841 Gross per 24 hour  Intake 1860 ml  Output --  Net 1860 ml    Physical Exam: Post lumbar surgery Lungs clear No murmur  Abdomen benign No edema  Lab Results:   Imaging: DG Lumbar Spine 2-3 Views  Result Date: 04/27/2023 CLINICAL DATA:  Elective surgery. EXAM: LUMBAR SPINE - 2-3 VIEW COMPARISON:  None Available. FINDINGS: Four fluoroscopic spot views of the lumbar spine obtained in the operating room in frontal and lateral projections. Posterior rod with intrapedicular screw fusion and interbody spacers from L2 through L4. Fluoroscopy time 125.8 seconds. Dose: 58.18 mGy IMPRESSION: Intraoperative fluoroscopy during lumbar fusion. Electronically Signed   By: Narda Rutherford M.D.   On: 04/27/2023 16:10   DG Lumbar Spine 1 View  Result Date: 04/27/2023 CLINICAL DATA:  Elective surgery. EXAM: LUMBAR SPINE - 1 VIEW COMPARISON:  None available. FINDINGS: Single intraop view of the lumbar spine obtained in the operating room. Spinal fusion hardware from L2 through L4 with interbody spacers in place. IMPRESSION: Spinal fusion hardware from L2 through L4. Electronically Signed   By: Narda Rutherford M.D.   On: 04/27/2023 16:09   DG C-Arm 1-60 Min-No Report  Result Date: 04/27/2023 Fluoroscopy was utilized by the requesting physician.  No radiographic interpretation.   DG C-Arm 1-60 Min-No Report  Result Date: 04/27/2023 Fluoroscopy was utilized  by the requesting physician.  No radiographic interpretation.    Cardiac Studies:  ECG: SR normal    Telemetry:  Echo: Normal EF 60-65% 04/25/23  Medications:    feeding supplement  237 mL Oral BID BM   gabapentin  600 mg Oral TID   midodrine  10 mg Oral BID WC   senna-docusate  1 tablet Oral BID   sodium chloride flush  3 mL Intravenous Q12H      sodium chloride     lactated ringers     methocarbamol (ROBAXIN) IV      Assessment/Plan:   Postural: improved with NS overnight and d/c beta blocker Ok to d/c with midodrine 10 mg bid Told patient to take it if BP < 100 mmHg at home and symptomatic She has BP cuff at home Can give her 2 week supply SVT:  non recurrent d/c lopressor given postural symptoms post op Ortho  per Dr Shon Baton Hct stable wound looks great d/c today She has had pain relief already from surgery  Charlton Haws 04/29/2023, 8:49 AM

## 2023-05-12 DIAGNOSIS — K115 Sialolithiasis: Secondary | ICD-10-CM | POA: Diagnosis not present

## 2023-06-08 DIAGNOSIS — Z4889 Encounter for other specified surgical aftercare: Secondary | ICD-10-CM | POA: Diagnosis not present

## 2023-06-08 DIAGNOSIS — M5451 Vertebrogenic low back pain: Secondary | ICD-10-CM | POA: Diagnosis not present

## 2023-06-09 ENCOUNTER — Ambulatory Visit: Payer: BC Managed Care – PPO | Admitting: Cardiovascular Disease

## 2023-06-11 DIAGNOSIS — M5451 Vertebrogenic low back pain: Secondary | ICD-10-CM | POA: Diagnosis not present

## 2023-06-13 NOTE — Progress Notes (Unsigned)
Cardiology Clinic Note   Patient Name: Catherine Leonard Date of Encounter: 06/15/2023  Primary Care Provider:  Lezlie Lye, FNP Primary Cardiologist:  None  Patient Profile     Catherine Leonard 57 year old female presents the clinic today for follow-up evaluation of her SVT and hypotension.  Past Medical History    Past Medical History:  Diagnosis Date   Ambulates with cane    and wheelchair r/t back pain   Anxiety    Arthritis    Chronic hyponatremia    Hip pain    bilateral, in [place]   Pneumonia    x 1 at age 69   Pure hypercholesterolemia    Past Surgical History:  Procedure Laterality Date   ANTERIOR LAT LUMBAR FUSION N/A 04/26/2023   Procedure: Extreme lateral interbody fusion (XLIF) L2-4;  Surgeon: Venita Lick, MD;  Location: [facility] OR;  Service: Orthopedics;  Laterality: N/A;   COLONOSCOPY  03/27/2016   in [place]   SPINAL FUSION N/A 04/27/2023   Procedure: Posterior fusion and instrumentation lumbar two to four;  Surgeon: Venita Lick, MD;  Location: [facility];  Service: Orthopedics;  Laterality: N/A;   TONSILLECTOMY     as a child    Allergies  No Known Allergies  History of Present Illness     Courtney S Sgro anxiety, arthritis, chronic hyponatremia, pneumonia, hyperlipidemia, postural hypotension, and SVT.  She was admitted to the hospital on 04/26/2023 and discharged on 04/29/2023.  She was admitted for lateral interbody fusion L2-4.  Cardiology was consulted for evaluation of her SVT and postural hypertension.  She was seen by Dr. Eden Emms.  Her blood pressure improved with IV hydration.  Her metoprolol was discontinued.  She was not noted to have recurrent episodes of SVT.  Her Lopressor was discontinued due to her postural hypotension symptoms.  She was discharged in stable condition on 04/29/2023.  Outpatient cardiac follow-up was scheduled.  She presents to the clinic today for follow-up evaluation and states she continues to recover from her back surgery.  She has  been increasing her physical activity and is walking around 1 and half miles 2 days/week.  We reviewed her episode of SVT and hospitalization.  She expressed understanding.  Her blood pressure today is 138/84.  Her heart rate is 90.  I will resume her metoprolol at 25 mg daily, give instructions for vagal maneuvers and plan follow-up in 6 to 9 months..  Today she denies chest pain, shortness of breath, lower extremity edema, fatigue, palpitations, melena, hematuria, hemoptysis, diaphoresis, weakness, presyncope, syncope, orthopnea, and PND.    Home Medications    Prior to Admission medications   Medication Sig Start Date End Date Taking? Authorizing Provider  ALPRAZolam Prudy Feeler) 0.5 MG tablet Take 0.5 mg by mouth 2 (two) times daily as needed for anxiety.    [provider]  Carboxymethylcellul-Glycerin (CLEAR EYES FOR DRY EYES) 1-0.25 % SOLN Place 1-2 drops into both eyes daily. After showering    [provider]  gabapentin (NEURONTIN) 300 MG capsule Take 600 mg by mouth 3 (three) times daily.    [provider]  metoprolol succinate (TOPROL-XL) 50 MG 24 hr tablet Take 1 tablet (50 mg total) by mouth daily. 04/25/23   Cathren Laine, MD  ondansetron (ZOFRAN) 4 MG tablet Take 1 tablet (4 mg total) by mouth every 8 (eight) hours as needed for nausea or vomiting. 04/27/23   Venita Lick, MD    Family History    No family  history on file. has no family status information on file.   Social History    Social History   Socioeconomic History   Marital status: Legally Separated    Spouse name: Not on file   Number of children: Not on file   Years of education: Not on file   Highest education level: Not on file  Occupational History   Not on file  Tobacco Use   Smoking status: Former    Current packs/day: 0.00    Average packs/day: 0.5 packs/day for 40.0 years (20.0 ttl pk-yrs)    Types: Cigarettes    Start date: 03/17/1983    Quit date: 03/17/2023    Years since  quitting: 0.2   Smokeless tobacco: Current  Vaping Use   Vaping status: Some Days   Substances: CBD  Substance and Sexual Activity   Alcohol use: Yes    Comment: wine - 1 bottle per night   Drug use: Yes    Types: Marijuana    Comment: daily- informed pt to not smoke 24 hrs prior to surgery   Sexual activity: Yes    Birth control/protection: Post-menopausal  Other Topics Concern   Not on file  Social History Narrative   Not on file   Social Determinants of Health   Financial Resource Strain: Not on file  Food Insecurity: Not on file  Transportation Needs: Not on file  Physical Activity: Not on file  Stress: Not on file  Social Connections: Unknown (03/01/2023)   [facility]   Social Network    Social Network: Not on file  Intimate Partner Violence: Unknown (03/01/2023)   Received from [facility]   HITS    Physically Hurt: Not on file    Insult or Talk Down To: Not on file    Threaten Physical Harm: Not on file    Scream or Curse: Not on file     Review of Systems    General:  No chills, fever, night sweats or weight changes.  Cardiovascular:  No chest pain, dyspnea on exertion, edema, orthopnea, palpitations, paroxysmal nocturnal dyspnea. Dermatological: No rash, lesions/masses Respiratory: No cough, dyspnea Urologic: No hematuria, dysuria Abdominal:   No nausea, vomiting, diarrhea, bright red blood per rectum, melena, or hematemesis Neurologic:  No visual changes, wkns, changes in mental status. All other systems reviewed and are otherwise negative except as noted above.  Physical Exam    VS:  BP 138/84   Pulse 90   Ht 5' (1.524 m)   Wt 126 lb 9.6 oz (57.4 kg)   SpO2 100%   BMI 24.72 kg/m  , BMI Body mass index is 24.72 kg/m. GEN: Well nourished, well developed, in no acute distress. HEENT: normal. Neck: Supple, no JVD, carotid bruits, or masses. Cardiac: RRR, no murmurs, rubs, or gallops. No clubbing, cyanosis, edema.  Radials/DP/PT 2+  and equal bilaterally.  Respiratory:  Respirations regular and unlabored, clear to auscultation bilaterally. GI: Soft, nontender, nondistended, BS + x 4. MS: no deformity or atrophy. Skin: warm and dry, no rash. Neuro:  Strength and sensation are intact. Psych: Normal affect.  Accessory Clinical Findings    Recent Labs: 04/25/2023: ALT 22; BUN 6; Creatinine, Ser 0.75; Hemoglobin 13.5; Platelets 420; Potassium 3.5; Sodium 133; TSH 1.562   Recent Lipid Panel No results found for: "CHOL", "TRIG", "HDL", "CHOLHDL", "VLDL", "LDLCALC", "LDLDIRECT"       ECG personally reviewed by me today-none today.     Echocardiogram 04/25/2023  IMPRESSIONS  1. Left ventricular ejection fraction, by estimation, is 60 to 65%. The  left ventricle has normal function. The left ventricle has no regional  wall motion abnormalities. Left ventricular diastolic parameters were  normal.   2. Right ventricular systolic function is normal. The right ventricular  size is normal.   3. The mitral valve is normal in structure. No evidence of mitral valve  regurgitation. No evidence of mitral stenosis.   4. The aortic valve is normal in structure. Aortic valve regurgitation is  not visualized. No aortic stenosis is present.   5. The inferior vena cava is normal in size with greater than 50%  respiratory variability, suggesting right atrial pressure of 3 mmHg.   FINDINGS   Left Ventricle: Left ventricular ejection fraction, by estimation, is 60  to 65%. The left ventricle has normal function. The left ventricle has no  regional wall motion abnormalities. The left ventricular internal cavity  size was normal in size. There is   no left ventricular hypertrophy. Left ventricular diastolic parameters  were normal.   Right Ventricle: The right ventricular size is normal. No increase in  right ventricular wall thickness. Right ventricular systolic function is  normal.   Left Atrium: Left atrial size was  normal in size.   Right Atrium: Right atrial size was normal in size.   Pericardium: There is no evidence of pericardial effusion.   Mitral Valve: The mitral valve is normal in structure. No evidence of  mitral valve regurgitation. No evidence of mitral valve stenosis.   Tricuspid Valve: The tricuspid valve is normal in structure. Tricuspid  valve regurgitation is not demonstrated. No evidence of tricuspid  stenosis.   Aortic Valve: The aortic valve is normal in structure. Aortic valve  regurgitation is not visualized. No aortic stenosis is present.   Pulmonic Valve: The pulmonic valve was normal in structure. Pulmonic valve  regurgitation is not visualized. No evidence of pulmonic stenosis.   Aorta: The aortic root is normal in size and structure.   Venous: The inferior vena cava is normal in size with greater than 50%  respiratory variability, suggesting right atrial pressure of 3 mmHg.   IAS/Shunts: No atrial level shunt detected by color flow Doppler.      Assessment & Plan   1.  Postural hypotension-BP today 138/84.  Noted during episode of SVT.  Symptoms improved with IV hydration.  Denies further episodes.  Has been monitoring blood pressure at home.  Appears to be isolated episode related to SVT. Maintain p.o. hydration. Maintain blood pressure log  History of SVT-denies recent episodes of accelerated or irregular heartbeat.  Metoprolol discontinued in the setting of postural hypertension.  Echocardiogram 04/25/2023 showed normal LVEF, normal diastolic parameters, and no valvular abnormalities. Restart metoprolol 25 mg daily Avoid triggers caffeine, chocolate, EtOH, dehydration etc. Vagal maneuvers as needed  Disposition: Follow-up with Dr. Eden Emms or me in 6-9 months.  Thomasene Ripple. Charnice Zwilling NP-C     06/15/2023, 2:55 PM [facility] [address] Office [phone] Fax [phone]    I spent 14 minutes examining this patient,  reviewing medications, and using patient centered shared decision making involving her cardiac care.  Prior to her visit I spent greater than 20 minutes reviewing her past medical history,  medications, and prior cardiac tests.

## 2023-06-15 ENCOUNTER — Ambulatory Visit: Payer: BC Managed Care – PPO | Attending: Internal Medicine | Admitting: General Practice

## 2023-06-15 ENCOUNTER — Encounter: Payer: Self-pay | Admitting: General Practice

## 2023-06-15 VITALS — BP 138/84 | HR 90 | Ht 60.0 in | Wt 126.6 lb

## 2023-06-15 DIAGNOSIS — I951 Orthostatic hypotension: Secondary | ICD-10-CM | POA: Diagnosis not present

## 2023-06-15 DIAGNOSIS — I471 Supraventricular tachycardia, unspecified: Secondary | ICD-10-CM

## 2023-06-15 MED ORDER — METOPROLOL SUCCINATE ER 50 MG PO TB24: 25.0000 mg | ORAL_TABLET | Freq: Every day | ORAL | 5 refills | Status: AC

## 2023-06-15 NOTE — Patient Instructions (Addendum)
Medication Instructions:  METOPROLOL 25MG  DAILY *If you need a refill on your cardiac medications before your next appointment, please call your pharmacy*  Lab Work: NONE If you have labs (blood work) drawn today and your tests are completely normal, you will receive your results only by:  MyChart Message (if you have MyChart) OR  A paper copy in the mail If you have any lab test that is abnormal or we need to change your treatment, we will call you to review the results.  Testing/Procedures: NONE  Follow-Up: At [facility], you and your health needs are our priority.  As part of our continuing mission to provide you with exceptional heart care, we have created designated Provider Care Teams.  These Care Teams include your primary Cardiologist (physician) and Advanced Practice Providers (APPs -  Physician Assistants and Nurse Practitioners) who all work together to provide you with the care you need, when you need it.  Your next appointment:   3-4 month(s)  Provider:   Charlton Haws, MD  or Edd Fabian, FNP        Other Instructions INCREASE PHYSICAL ACTIVITY-AS TOLERATED MAINTAIN BLOOD PRESSURE LOG PLEASE READ AND FOLLOW ATTACHED  SALTY 6  MAINTAIN HYDRATION Please try to avoid these triggers: Do not use any products that have nicotine or tobacco in them. These include cigarettes, e-cigarettes, and chewing tobacco. If you need help quitting, ask your doctor. Eat heart-healthy foods. Talk with your doctor about the right eating plan for you. Exercise regularly as told by your doctor. Stay hydrated Do not drink alcohol, Caffeine or chocolate. Lose weight if you are overweight. Do not use drugs, including cannabis   VAGAL MANEUVERS:  Sit down or lie down. 2.  Take a deep breath and hold it by closing your throat. 30  Bear down hard, as if you're trying to go to the bathroom. 4. Strain hard for about 10 to 15 seconds. 5.  Release the air when you're done. 6.  Wait at  least a minute before you try again.

## 2023-06-16 DIAGNOSIS — M5451 Vertebrogenic low back pain: Secondary | ICD-10-CM | POA: Diagnosis not present

## 2023-06-18 DIAGNOSIS — M5451 Vertebrogenic low back pain: Secondary | ICD-10-CM | POA: Diagnosis not present

## 2023-06-21 ENCOUNTER — Ambulatory Visit: Payer: BC Managed Care – PPO | Admitting: Cardiovascular Disease

## 2023-06-22 DIAGNOSIS — M5451 Vertebrogenic low back pain: Secondary | ICD-10-CM | POA: Diagnosis not present

## 2023-06-25 DIAGNOSIS — M5451 Vertebrogenic low back pain: Secondary | ICD-10-CM | POA: Diagnosis not present

## 2023-06-29 DIAGNOSIS — M5451 Vertebrogenic low back pain: Secondary | ICD-10-CM | POA: Diagnosis not present

## 2023-07-02 DIAGNOSIS — M5451 Vertebrogenic low back pain: Secondary | ICD-10-CM | POA: Diagnosis not present

## 2023-07-06 DIAGNOSIS — M5451 Vertebrogenic low back pain: Secondary | ICD-10-CM | POA: Diagnosis not present

## 2023-07-09 DIAGNOSIS — M5451 Vertebrogenic low back pain: Secondary | ICD-10-CM | POA: Diagnosis not present

## 2023-07-15 DIAGNOSIS — M5451 Vertebrogenic low back pain: Secondary | ICD-10-CM | POA: Diagnosis not present

## 2023-07-20 DIAGNOSIS — Z4889 Encounter for other specified surgical aftercare: Secondary | ICD-10-CM | POA: Diagnosis not present

## 2023-10-15 NOTE — Progress Notes (Unsigned)
CARDIOLOGY CONSULT NOTE       Patient ID: Catherine Leonard MRN: [id number] DOB/AGE: [birth year] 57 y.o.  Referring Physician: Dareen Piano Primary Physician: Lezlie Lye, FNP Primary Cardiologist: Eden Emms Reason for Consultation: F/U SVT   HPI:  56 y.o. first seen by me 04/26/23 after having lumbar surgery with Dr Shon Baton A day latter she had SVT rate > 200 DCC in field by EMS Baseline ECG normal no pre excitation TTE normal EF 60-65% Had some postural symptoms improved with fluids Labs were fine including BUN/Cr, TSH and Hct. Seen by PA 06/15/23 continues to improve from back surgery BP was 138/84 mmhg at this visit HR 90 and beta blocker resumed   Discussed avoiding triggers like caffeine, chocolate, ETHO and dehydration. Also discussed vagal maneuvers.   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 Ex husband passed away in July 18, 2023 but she has to deal with a lot of the fall out with her two daughters one in [place] and one in [place]. She has been with same boyfriend 9 years He has had MI"s and sees Dr Jens Som  ROS All other systems reviewed and negative except as noted above  Past Medical History:  Diagnosis Date   Ambulates with cane    and wheelchair r/t back pain   Anxiety    Arthritis    Chronic hyponatremia    Hip pain    bilateral, in [place]   Pneumonia    x 1 at age 1   Pure hypercholesterolemia     No family history on file.  Social History   Socioeconomic History   Marital status: Legally Separated    Spouse name: Not on file   Number of children: Not on file   Years of education: Not on file   Highest education level: Not on file  Occupational History   Not on file  Tobacco Use   Smoking status: Former    Current packs/day: 0.00    Average packs/day: 0.5 packs/day for 40.0 years (20.0 ttl pk-yrs)    Types: Cigarettes    Start date: 03/17/1983    Quit date: 03/17/2023    Years since quitting: 0.5   Smokeless  tobacco: Current  Vaping Use   Vaping status: Some Days   Substances: CBD  Substance and Sexual Activity   Alcohol use: Yes    Comment: wine - 1 bottle per night   Drug use: Yes    Types: Marijuana    Comment: daily- informed pt to not smoke 24 hrs prior to surgery   Sexual activity: Yes    Birth control/protection: Post-menopausal  Other Topics Concern   Not on file  Social History Narrative   Not on file   Social Drivers of Health   Financial Resource Strain: Not on file  Food Insecurity: Not on file  Transportation Needs: Not on file  Physical Activity: Not on file  Stress: Not on file  Social Connections: Unknown (03/01/2023)   Received from [facility], [facility]   Social Network    Social Network: Not on file  Intimate Partner Violence: Unknown (03/01/2023)   Received from [facility]   HITS    Physically Hurt: Not on file    Insult or Talk Down To: Not on file    Threaten Physical Harm: Not on file    Scream or Curse: Not on file    Past Surgical History:  Procedure Laterality Date   ANTERIOR LAT LUMBAR FUSION N/A 04/26/2023   Procedure: Extreme lateral interbody fusion (XLIF) L2-4;  Surgeon: Venita Lick, MD;  Location: [facility] OR;  Service: Orthopedics;  Laterality: N/A;   COLONOSCOPY  03/27/2016   in [place]   SPINAL FUSION N/A 04/27/2023   Procedure: Posterior fusion and instrumentation lumbar two to four;  Surgeon: Venita Lick, MD;  Location[facility] OR;  Service: Orthopedics;  Laterality: N/A;   TONSILLECTOMY     as a child      Current Outpatient Medications:    ALPRAZolam (XANAX) 0.5 MG tablet, Take 0.5 mg by mouth 2 (two) times daily as needed for anxiety., Disp: , Rfl:    Carboxymethylcellul-Glycerin (CLEAR EYES FOR DRY EYES) 1-0.25 % SOLN, Place 1-2 drops into both eyes daily. After showering, Disp: , Rfl:    gabapentin (NEURONTIN) 300 MG capsule, Take 600 mg by mouth 3 (three) times daily. (Patient not taking: Reported on 10/21/2023),  Disp: , Rfl:    metoprolol succinate (TOPROL-XL) 50 MG 24 hr tablet, Take 0.5 tablets (25 mg total) by mouth daily., Disp: 15 tablet, Rfl: 5   ondansetron (ZOFRAN) 4 MG tablet, Take 1 tablet (4 mg total) by mouth every 8 (eight) hours as needed for nausea or vomiting. (Patient not taking: Reported on 10/21/2023), Disp: 20 tablet, Rfl: 0    Physical Exam: Blood pressure 120/82, pulse 88, height 5' (1.524 m), weight 137 lb 9.6 oz (62.4 kg), SpO2 95%.    Affect appropriate Healthy:  appears stated age HEENT: normal Neck supple with no adenopathy JVP normal no bruits no thyromegaly Lungs clear with no wheezing and good diaphragmatic motion Heart:  S1/S2 no murmur, no rub, gallop or click PMI normal Abdomen: benighn, BS positve, no tenderness, no AAA no bruit.  No HSM or HJR Distal pulses intact with no bruits Prior lumbar surgery    Labs:   Lab Results  Component Value Date   WBC 8.1 04/25/2023   HGB 13.5 04/25/2023   HCT 39.0 04/25/2023   MCV 99.5 04/25/2023   PLT 420 (H) 04/25/2023   No results for input(s): "NA", "K", "CL", "CO2", "BUN", "CREATININE", "CALCIUM", "PROT", "BILITOT", "ALKPHOS", "ALT", "AST", "GLUCOSE" in the last 168 hours.  Invalid input(s): "LABALBU" No results found for: "CKTOTAL", "CKMB", "CKMBINDEX", "TROPONINI" No results found for: "CHOL" No results found for: "HDL" No results found for: "LDLCALC" No results found for: "TRIG" No results found for: "CHOLHDL" No results found for: "LDLDIRECT"    Radiology: No results found.  EKG: SR rate 99 normal 04/25/23   ASSESSMENT AND PLAN:   SVT: isolated post lumbar surgery. Labs including TSH normal Normal baseline ECG and echo with no structural heart dx Continue low dose beta blocker Postural Hypotension: resolved likely from lumbar surgery and anesthesia Neuropathy:  continue neurontin  F/U PRN  Signed: Charlton Haws 10/21/2023, 2:09 PM

## 2023-10-21 ENCOUNTER — Ambulatory Visit: Payer: 59 | Attending: Cardiovascular Disease | Admitting: Cardiovascular Disease

## 2023-10-21 VITALS — BP 120/82 | HR 88 | Ht 60.0 in | Wt 137.6 lb

## 2023-10-21 DIAGNOSIS — I471 Supraventricular tachycardia, unspecified: Secondary | ICD-10-CM

## 2023-10-21 DIAGNOSIS — I951 Orthostatic hypotension: Secondary | ICD-10-CM | POA: Diagnosis not present

## 2023-10-21 NOTE — Patient Instructions (Signed)
Medication Instructions:  Your physician recommends that you continue on your current medications as directed. Please refer to the Current Medication list given to you today.  *If you need a refill on your cardiac medications before your next appointment, please call your pharmacy*  Lab Work: If you have labs (blood work) drawn today and your tests are completely normal, you will receive your results only by: [place] (if you have MyChart) OR A paper copy in the mail If you have any lab test that is abnormal or we need to change your treatment, we will call you to review the results.  Testing/Procedures: None ordered today.  Follow-Up: At [facility], you and your health needs are our priority.  As part of our continuing mission to provide you with exceptional heart care, we have created designated Provider Care Teams.  These Care Teams include your primary Cardiologist (physician) and Advanced Practice Providers (APPs -  Physician Assistants and Nurse Practitioners) who all work together to provide you with the care you need, when you need it.  We recommend signing up for the patient portal called "MyChart".  Sign up information is provided on this After Visit Summary.  MyChart is used to connect with patients for Virtual Visits (Telemedicine).  Patients are able to view lab/test results, encounter notes, upcoming appointments, etc.  Non-urgent messages can be sent to your provider as well.   To learn more about what you can do with MyChart, go to [email or web address].    Your next appointment:   As needed   Provider:   Jenkins Rouge, MD

## 2024-03-06 ENCOUNTER — Ambulatory Visit: Payer: Self-pay | Admitting: Family Medicine

## 2024-03-29 ENCOUNTER — Ambulatory Visit: Payer: Self-pay | Admitting: Family Medicine
